# Patient Record
Sex: Female | Born: 1995 | Race: White | Hispanic: No | Marital: Single | Smoking: Former smoker
Health system: Southern US, Community
[De-identification: ages and names within clinical notes are randomized; demographics above are authoritative.]

## PROBLEM LIST (undated history)

## (undated) ENCOUNTER — Inpatient Hospital Stay (HOSPITAL_COMMUNITY): Payer: Self-pay

## (undated) DIAGNOSIS — Z789 Other specified health status: Secondary | ICD-10-CM

## (undated) HISTORY — PX: NO PAST SURGERIES: SHX2092

---
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
## 2015-01-30 ENCOUNTER — Emergency Department (HOSPITAL_COMMUNITY)
Admission: EM | Admit: 2015-01-30 | Discharge: 2015-01-30 | Disposition: A | Discharge status: Home / Self Care | Payer: Self-pay | Attending: Emergency Medicine | Admitting: Emergency Medicine

## 2015-01-30 ENCOUNTER — Encounter (HOSPITAL_COMMUNITY): Payer: Self-pay | Admitting: Emergency Medicine

## 2015-01-30 DIAGNOSIS — H109 Unspecified conjunctivitis: Secondary | ICD-10-CM | POA: Insufficient documentation

## 2015-01-30 DIAGNOSIS — L03011 Cellulitis of right finger: Secondary | ICD-10-CM | POA: Insufficient documentation

## 2015-01-30 MED ORDER — LIDOCAINE HCL (PF) 1 % IJ SOLN
5.0000 mL | Freq: Once | INTRAMUSCULAR | Status: AC
  Administered 2015-01-30: 5 mL
  Filled 2015-01-30: qty 5

## 2015-01-30 MED ORDER — POLYMYXIN B-TRIMETHOPRIM 10000-0.1 UNIT/ML-% OP SOLN
1.0000 [drp] | OPHTHALMIC | Status: DC
  Administered 2015-01-30: 1 [drp] via OPHTHALMIC
  Filled 2015-01-30: qty 10

## 2015-01-30 MED ORDER — CEPHALEXIN 500 MG PO CAPS: 500.0000 mg | ORAL_CAPSULE | Freq: Four times a day (QID) | ORAL | Status: DC

## 2015-01-30 MED ORDER — CHLORHEXIDINE GLUCONATE 4 % EX LIQD: Freq: Every day | CUTANEOUS | Status: DC | PRN

## 2015-01-30 NOTE — Discharge Instructions (Signed)
Paronychia °Paronychia is an infection of the skin that surrounds a nail. It usually affects the skin around a fingernail, but it may also occur near a toenail. It often causes pain and swelling around the nail. This condition may come on suddenly or develop over a longer period. In some cases, a collection of pus (abscess) can form near or under the nail. Usually, paronychia is not serious and it clears up with treatment. °CAUSES °This condition may be caused by bacteria or fungi. It is commonly caused by either Streptococcus or Staphylococcus bacteria. The bacteria or fungi often cause the infection by getting into the affected area through an opening in the skin, such as a cut or a hangnail. °RISK FACTORS °This condition is more likely to develop in: °· People who get their hands wet often, such as those who work as dishwashers, bartenders, or nurses. °· People who bite their fingernails or suck their thumbs. °· People who trim their nails too short. °· People who have hangnails or injured fingertips. °· People who get manicures. °· People who have diabetes. °SYMPTOMS °Symptoms of this condition include: °· Redness and swelling of the skin near the nail. °· Tenderness around the nail when you touch the area. °· Pus-filled bumps under the cuticle. The cuticle is the skin at the base or sides of the nail. °· Fluid or pus under the nail. °· Throbbing pain in the area. °DIAGNOSIS °This condition is usually diagnosed with a physical exam. In some cases, a sample of pus may be taken from an abscess to be tested in a lab. This can help to determine what type of bacteria or fungi is causing the condition. °TREATMENT °Treatment for this condition depends on the cause and severity of the condition. If the condition is mild, it may clear up on its own in a few days. Your health care provider may recommend soaking the affected area in warm water a few times a day. When treatment is needed, the options may  include: °· Antibiotic medicine, if the condition is caused by a bacterial infection. °· Antifungal medicine, if the condition is caused by a fungal infection. °· Incision and drainage, if an abscess is present. In this procedure, the health care provider will cut open the abscess so the pus can drain out. °HOME CARE INSTRUCTIONS °· Soak the affected area in warm water if directed to do so by your health care provider. You may be told to do this for 20 minutes, 2-3 times a day. Keep the area dry in between soakings. °· Take medicines only as directed by your health care provider. °· If you were prescribed an antibiotic medicine, finish all of it even if you start to feel better. °· Keep the affected area clean. °· Do not try to drain a fluid-filled bump yourself. °· If you will be washing dishes or performing other tasks that require your hands to get wet, wear rubber gloves. You should also wear gloves if your hands might come in contact with irritating substances, such as cleaners or chemicals. °· Follow your health care provider's instructions about: °¨ Wound care. °¨ Bandage (dressing) changes and removal. °SEEK MEDICAL CARE IF: °· Your symptoms get worse or do not improve with treatment. °· You have a fever or chills. °· You have redness spreading from the affected area. °· You have continued or increased fluid, blood, or pus coming from the affected area. °· Your finger or knuckle becomes swollen or is difficult to move. °  °  This information is not intended to replace advice given to you by your health care provider. Make sure you discuss any questions you have with your health care provider.   Document Released: 07/17/2000 Document Revised: 06/07/2014 Document Reviewed: 12/29/2013 Elsevier Interactive Patient Education 2016 Elsevier Inc. Viral Conjunctivitis Viral conjunctivitis is an inflammation of the clear membrane that covers the white part of your eye and the inner surface of your eyelid  (conjunctiva). The inflammation is caused by a viral infection. The blood vessels in the conjunctiva become inflamed, causing the eye to become red or pink, and often itchy. Viral conjunctivitis can easily be passed from one person to another (contagious). CAUSES  Viral conjunctivitis is caused by a virus. A virus is a type of contagious germ. It can be spread by touching objects that have been contaminated with the virus, such as doorknobs or towels.  SYMPTOMS  Symptoms of viral conjunctivitis may include:   Eye redness.  Tearing or watery eyes.  Itchy eyes.  Burning feeling in the eyes.  Clear drainage from the eye.  Swollen eyelids.  A gritty feeling in the eye.  Light sensitivity. DIAGNOSIS  Viral conjunctivitis may be diagnosed with a medical history and physical exam. If you have discharge from your eye, the discharge may be tested to rule out other causes of conjunctivitis.  TREATMENT  Viral conjunctivitis does not respond to medicines that kill bacteria (antibiotics). Treatment for viral conjunctivitis is directed at stopping a bacterial infection from developing in addition to the viral infection. Treatment also aims to relieve your symptoms, such as itching. This may be done with antihistamine drops or other eye medicines. HOME CARE INSTRUCTIONS  Take medicines only as directed by your health care provider.  Avoid touching or rubbing your eyes.  Apply a warm, clean washcloth to your eye for 10-20 minutes, 3-4 times per day.  If you wear contact lenses, do not wear them until the inflammation is gone and your health care provider says it is safe to wear them again. Ask your health care provider how to sterilize or replace your contact lenses before using them again. Wear glasses until you can resume wearing contacts.  Avoid wearing eye makeup until the inflammation is gone. Throw away any old eye cosmetics that may be contaminated.  Change or wash your pillowcase every  day.  Do not share towels or washcloths. This may spread the infection.  Wash your hands often with soap and water. Use paper towels to dry your hands.  Gently wipe away any drainage from your eye with a warm, wet washcloth or a cotton ball.  Be very careful to avoid touching the edge of the eyelid with the eye drop bottle or ointment tube when applying medicines to the affected eye. This will stop you from spreading the infection to the other eye or to other people. SEEK MEDICAL CARE IF:   Your symptoms do not improve with treatment.  You have increased pain.  Your vision becomes blurry.  You have a fever.  You have facial pain, redness, or swelling.  You have new symptoms.  Your symptoms get worse.   This information is not intended to replace advice given to you by your health care provider. Make sure you discuss any questions you have with your health care provider.   Document Released: 04/13/2002 Document Revised: 07/15/2005 Document Reviewed: 11/02/2013 Elsevier Interactive Patient Education [employer] Elsevier Inc.

## 2015-01-30 NOTE — ED Notes (Signed)
Pt departed in NAD.  

## 2015-01-30 NOTE — ED Notes (Signed)
Vision 20/15 out of infected L eye.Dr. Truitt MerleNotified

## 2015-01-30 NOTE — ED Provider Notes (Signed)
CSN: [id number]     Arrival date & time 01/30/15  2002 History  By signing my name below, I, Pamela Mitchell, attest that this documentation has been prepared under the direction and in the presence of Roxy Horsemanobert Stephanie Mcglone, PA-C Electronically Signed: Jarvis Morganaylor Mitchell, ED Scribe. 01/30/2015. 8:38 PM.    Chief Complaint  Patient presents with  . Eye Problem   The history is provided by the patient. No language interpreter was used.    HPI Comments: Pamela Mitchell is a 19 y.o. female with no PMHx who presents to the Emergency Department with a chief complaint of constant, moderate, left eye redness onset today. She reports associated green discharge and pain to her left eye. Pt denies any aggravating/alleviating factors. She has not taken any medications or tried any treatments prior to arrival. Pt denies any known foreign objects in her eye. She denies any known sick contacts. Pt states she has not used any new makeup products on her eyes. She denies any visual changes, eye itching, or other associated symptoms at this time.  Pt has a second complaint of constant, moderate, distal right middle finger pain onset 3 days. She reports associated swelling and redness to the tip of the finger and along the nailbed. Pt notes she had a fake nail to the finger that broke and she had to remove it. She states the pain is exacerbated with applied pressure to the nail. She denies any alleviating factors. Pt has not had any medications prior to arrival or other treatments tried. She denies any fevers, chills, or other associated symptoms.   History reviewed. No pertinent past medical history. History reviewed. No pertinent past surgical history. No family history on file. Social History  Substance Use Topics  . Smoking status: None  . Smokeless tobacco: None  . Alcohol Use: None   OB History    No data available     Review of Systems  Eyes: Positive for pain, discharge and redness. Negative for itching and  visual disturbance.  Musculoskeletal: Positive for arthralgias.  Skin: Positive for color change.      Allergies  Review of patient's allergies indicates no known allergies.  Home Medications   Prior to Admission medications   Not on File   Triage Vitals: BP 134/76 mmHg  Pulse 97  Temp(Src) 98 F (36.7 C) (Oral)  Resp 16  Ht 5\' 1"  (1.549 m)  Wt 122 lb (55.339 kg)  BMI 23.06 kg/m2  SpO2 98%  LMP 01/23/2015 (Approximate)  Physical Exam Physical Exam  Constitutional: Pt is oriented to person, place, and time. Pt appears well-developed and well-nourished. No distress.  HENT:  Head: Normocephalic and atraumatic.  Nose: Nose normal. No mucosal edema or rhinorrhea.  Mouth/Throat: Uvula is midline, oropharynx is clear and moist and mucous membranes are normal. No uvula swelling. No oropharyngeal exudate, posterior oropharyngeal edema, posterior oropharyngeal erythema or tonsillar abscesses.  Eyes: left conjunctivitis. Pupils are equal, round, and reactive to light. Lids are everted and swept, no foreign bodies found. Pupils equal round and reactive to light No vertical, horizontal or rotational nystagmus No visible foreign body    Visual Acuity  Right Eye Distance:   Left Eye Distance: 20/15 Bilateral Distance:    Right Eye Near:   Left Eye Near:    Bilateral Near:      Neck: Normal range of motion.   Cardiovascular: Normal rate, regular rhythm and intact distal pulses.   Pulmonary/Chest: Effort normal. No respiratory distress.  Musculoskeletal: Normal range  of motion.  Neurological: Pt is alert and oriented to person, place, and time.  Cranial Nerves:  II:  Peripheral visual fields grossly normal, pupils equal, round, reactive to light III,IV, VI: ptosis not present, extra-ocular motions intact bilaterally  Skin: Skin is warm and dry. Pt is not diaphoretic.  Paranychia noted to distal left middle finger Psychiatric: Pt has a normal mood and affect.  Nursing note  and vitals reviewed.   ED Course  Procedures (including critical care time)  DIAGNOSTIC STUDIES: Oxygen Saturation is 98% on RA, normal by my interpretation.    COORDINATION OF CARE:  INCISION AND DRAINAGE Performed by: Roxy Horseman Consent: Verbal consent obtained. Risks and benefits: risks, benefits and alternatives were discussed Type: abscess  Body area: Right middle finger  Anesthesia: Digital block Incision was made with a scalpel.  Local anesthetic: lidocaine 2% without epinephrine  Anesthetic total: 3 ml  Complexity: complex Blunt dissection to break up loculations  Drainage: purulent  Drainage amount: moderate  Packing material: none  Patient tolerance: Patient tolerated the procedure well with no immediate complications.       MDM   Final diagnoses:  Conjunctivitis of left eye  Paronychia, right   Patient with conjunctivitis, will treat with polytrim and recommend ophthalmology follow-up in 2 days.  Also has paronychia. This was drained in the emergency department with good success. Recommend hand follow-up.  I personally performed the services described in this documentation, which was scribed in my presence. The recorded information has been reviewed and is accurate.        Roxy Horseman, PA-C 01/30/15 2352  Derwood Kaplan, MD 01/31/15 [phone]

## 2015-01-30 NOTE — ED Notes (Signed)
Pt noticed "green stuff" coming out of her eye today. Also notes swelling, discomfort to finger on L hand.

## 2016-02-05 NOTE — L&D Delivery Note (Signed)
Patient is a 21 y.o. now G1P1001 s/p NSVD at 2222w1d, who was admitted for SOL, SROM at 0323 am, and had uncomplicated labor course. GBS neg  Delivery Note At 4:54 AM a viable female was delivered via Vaginal, Spontaneous Delivery  Presentation: LOA.  APGAR: 9, 9 weight  .   Placenta status: intact; to L&D. 3-v cord  Anesthesia:  IV pain meds Episiotomy: None Lacerations: 2nd degree;Perineal Suture Repair: 3.0 vicryl Est. Blood Loss (mL): 100  Head delivered LOA. No nuchal cord present. Shoulder and body delivered in usual fashion. Infant with spontaneous cry, placed on mother's abdomen, dried and bulb suctioned. Cord clamped x 2 after 1-minute delay, and cut by family member. Cord blood drawn. Placenta delivered spontaneously with gentle cord traction. Fundus firm with massage and Pitocin. Perineum inspected and found to have 2nd degree laceration, which was repaired with 3.0 Vycril with good hemostasis achieved.  Mom to postpartum.  Baby to Couplet care / Skin to Skin.   Raynelle FanningJulie P. Valen Mascaro, MD OB Fellow 10/14/16, 5:35 AM

## 2016-03-13 ENCOUNTER — Encounter: Payer: Self-pay | Admitting: Obstetrics & Gynecology

## 2016-03-13 ENCOUNTER — Ambulatory Visit (INDEPENDENT_AMBULATORY_CARE_PROVIDER_SITE_OTHER): Payer: Medicaid Other | Admitting: *Deleted

## 2016-03-13 DIAGNOSIS — Z3201 Encounter for pregnancy test, result positive: Secondary | ICD-10-CM | POA: Diagnosis not present

## 2016-03-13 DIAGNOSIS — Z3200 Encounter for pregnancy test, result unknown: Secondary | ICD-10-CM

## 2016-03-13 LAB — POCT PREGNANCY, URINE: PREG TEST UR: POSITIVE — AB

## 2016-03-13 NOTE — Progress Notes (Signed)
+  UPT today.  LMP - 01/07/16 - uncertain.  EDD - 10/13/16 Medication reconciliation completed.

## 2016-03-21 LAB — OB RESULTS CONSOLE RPR: RPR: NONREACTIVE

## 2016-03-21 LAB — OB RESULTS CONSOLE HIV ANTIBODY (ROUTINE TESTING): HIV: NONREACTIVE

## 2016-03-21 LAB — OB RESULTS CONSOLE ANTIBODY SCREEN: ANTIBODY SCREEN: NEGATIVE

## 2016-03-21 LAB — OB RESULTS CONSOLE GC/CHLAMYDIA
Chlamydia: NEGATIVE
Gonorrhea: NEGATIVE

## 2016-03-21 LAB — OB RESULTS CONSOLE ABO/RH: RH Type: NEGATIVE

## 2016-03-21 LAB — OB RESULTS CONSOLE HEPATITIS B SURFACE ANTIGEN: Hepatitis B Surface Ag: NEGATIVE

## 2016-03-21 LAB — OB RESULTS CONSOLE RUBELLA ANTIBODY, IGM: RUBELLA: IMMUNE

## 2016-03-22 ENCOUNTER — Other Ambulatory Visit (HOSPITAL_COMMUNITY): Payer: Self-pay | Admitting: Nurse Practitioner

## 2016-03-22 DIAGNOSIS — Z3682 Encounter for antenatal screening for nuchal translucency: Secondary | ICD-10-CM

## 2016-03-22 DIAGNOSIS — Z3A13 13 weeks gestation of pregnancy: Secondary | ICD-10-CM

## 2016-03-26 ENCOUNTER — Encounter (HOSPITAL_COMMUNITY): Payer: Self-pay | Admitting: Nurse Practitioner

## 2016-04-02 ENCOUNTER — Inpatient Hospital Stay (HOSPITAL_COMMUNITY)
Admission: AD | Admit: 2016-04-02 | Discharge: 2016-04-02 | Disposition: A | Discharge status: Home / Self Care | Payer: Medicaid Other | Source: Ambulatory Visit | Attending: Family Medicine | Admitting: Family Medicine

## 2016-04-02 ENCOUNTER — Encounter (HOSPITAL_COMMUNITY): Payer: Self-pay | Admitting: *Deleted

## 2016-04-02 DIAGNOSIS — M5432 Sciatica, left side: Secondary | ICD-10-CM | POA: Insufficient documentation

## 2016-04-02 DIAGNOSIS — M545 Low back pain: Secondary | ICD-10-CM | POA: Insufficient documentation

## 2016-04-02 DIAGNOSIS — M549 Dorsalgia, unspecified: Secondary | ICD-10-CM

## 2016-04-02 DIAGNOSIS — O26891 Other specified pregnancy related conditions, first trimester: Secondary | ICD-10-CM | POA: Diagnosis not present

## 2016-04-02 DIAGNOSIS — O9989 Other specified diseases and conditions complicating pregnancy, childbirth and the puerperium: Secondary | ICD-10-CM

## 2016-04-02 DIAGNOSIS — O99891 Other specified diseases and conditions complicating pregnancy: Secondary | ICD-10-CM

## 2016-04-02 LAB — URINALYSIS, ROUTINE W REFLEX MICROSCOPIC
Bilirubin Urine: NEGATIVE
GLUCOSE, UA: NEGATIVE mg/dL
HGB URINE DIPSTICK: NEGATIVE
KETONES UR: NEGATIVE mg/dL
Leukocytes, UA: NEGATIVE
Nitrite: NEGATIVE
PH: 7 (ref 5.0–8.0)
PROTEIN: NEGATIVE mg/dL
Specific Gravity, Urine: 1.015 (ref 1.005–1.030)

## 2016-04-02 MED ORDER — CYCLOBENZAPRINE HCL 10 MG PO TABS: 5.0000 mg | ORAL_TABLET | Freq: Three times a day (TID) | ORAL | 0 refills | Status: DC | PRN

## 2016-04-02 MED ORDER — IBUPROFEN 600 MG PO TABS: 600.0000 mg | ORAL_TABLET | Freq: Four times a day (QID) | ORAL | 0 refills | Status: DC | PRN

## 2016-04-02 MED ORDER — IBUPROFEN 600 MG PO TABS
600.0000 mg | ORAL_TABLET | Freq: Once | ORAL | Status: AC
  Administered 2016-04-02: 600 mg via ORAL
  Filled 2016-04-02: qty 1

## 2016-04-02 NOTE — Discharge Instructions (Signed)
Sciatica Sciatica is pain, numbness, weakness, or tingling along the path of the sciatic nerve. The sciatic nerve starts in the lower back and runs down the back of each leg. The nerve controls the muscles in the lower leg and in the back of the knee. It also provides feeling (sensation) to the back of the thigh, the lower leg, and the sole of the foot. Sciatica is a symptom of another medical condition that pinches or puts pressure on the sciatic nerve. Generally, sciatica only affects one side of the body. Sciatica usually goes away on its own or with treatment. In some cases, sciatica may keep coming back (recur). What are the causes? This condition is caused by pressure on the sciatic nerve, or pinching of the sciatic nerve. This may be the result of:  A disk in between the bones of the spine (vertebrae) bulging out too far (herniated disk).  Age-related changes in the spinal disks (degenerative disk disease).  A pain disorder that affects a muscle in the buttock (piriformis syndrome).  Extra bone growth (bone spur) near the sciatic nerve.  An injury or break (fracture) of the pelvis.  Pregnancy.  Tumor (rare). What increases the risk? The following factors may make you more likely to develop this condition:  Playing sports that place pressure or stress on the spine, such as football or weight lifting.  Having poor strength and flexibility.  A history of back injury.  A history of back surgery.  Sitting for long periods of time.  Doing activities that involve repetitive bending or lifting.  Obesity. What are the signs or symptoms? Symptoms can vary from mild to very severe, and they may include:  Any of these problems in the lower back, leg, hip, or buttock:  Mild tingling or dull aches.  Burning sensations.  Sharp pains.  Numbness in the back of the calf or the sole of the foot.  Leg weakness.  Severe back pain that makes movement difficult. These symptoms may  get worse when you cough, sneeze, or laugh, or when you sit or stand for long periods of time. Being overweight may also make symptoms worse. In some cases, symptoms may recur over time. How is this diagnosed? This condition may be diagnosed based on:  Your symptoms.  A physical exam. Your health care provider may ask you to do certain movements to check whether those movements trigger your symptoms.  You may have tests, including:  Blood tests.  X-rays.  MRI.  CT scan. How is this treated? In many cases, this condition improves on its own, without any treatment. However, treatment may include:  Reducing or modifying physical activity during periods of pain.  Exercising and stretching to strengthen your abdomen and improve the flexibility of your spine.  Icing and applying heat to the affected area.  Medicines that help:  To relieve pain and swelling.  To relax your muscles.  Injections of medicines that help to relieve pain, irritation, and inflammation around the sciatic nerve (steroids).  Surgery. Follow these instructions at home: Medicines   Take over-the-counter and prescription medicines only as told by your health care provider.  Do not drive or operate heavy machinery while taking prescription pain medicine. Managing pain   If directed, apply ice to the affected area.  Put ice in a plastic bag.  Place a towel between your skin and the bag.  Leave the ice on for 20 minutes, 2-3 times a day.  After icing, apply heat to the  affected area before you exercise or as often as told by your health care provider. Use the heat source that your health care provider recommends, such as a moist heat pack or a heating pad.  Place a towel between your skin and the heat source.  Leave the heat on for 20-30 minutes.  Remove the heat if your skin turns bright red. This is especially important if you are unable to feel pain, heat, or cold. You may have a greater risk of  getting burned. Activity   Return to your normal activities as told by your health care provider. Ask your health care provider what activities are safe for you.  Avoid activities that make your symptoms worse.  Take brief periods of rest throughout the day. Resting in a lying or standing position is usually better than sitting to rest.  When you rest for longer periods, mix in some mild activity or stretching between periods of rest. This will help to prevent stiffness and pain.  Avoid sitting for long periods of time without moving. Get up and move around at least one time each hour.  Exercise and stretch regularly, as told by your health care provider.  Do not lift anything that is heavier than 10 lb (4.5 kg) while you have symptoms of sciatica. When you do not have symptoms, you should still avoid heavy lifting, especially repetitive heavy lifting.  When you lift objects, always use proper lifting technique, which includes:  Bending your knees.  Keeping the load close to your body.  Avoiding twisting. General instructions   Use good posture.  Avoid leaning forward while sitting.  Avoid hunching over while standing.  Maintain a healthy weight. Excess weight puts extra stress on your back and makes it difficult to maintain good posture.  Wear supportive, comfortable shoes. Avoid wearing high heels.  Avoid sleeping on a mattress that is too soft or too hard. A mattress that is firm enough to support your back when you sleep may help to reduce your pain.  Keep all follow-up visits as told by your health care provider. This is important. Contact a health care provider if:  You have pain that wakes you up when you are sleeping.  You have pain that gets worse when you lie down.  Your pain is worse than you have experienced in the past.  Your pain lasts longer than 4 weeks.  You experience unexplained weight loss. Get help right away if:  You lose control of your bowel  or bladder (incontinence).  You have:  Weakness in your lower back, pelvis, buttocks, or legs that gets worse.  Redness or swelling of your back.  A burning sensation when you urinate. This information is not intended to replace advice given to you by your health care provider. Make sure you discuss any questions you have with your health care provider. Document Released: 01/15/2001 Document Revised: 06/27/2015 Document Reviewed: 09/30/2014 Elsevier Interactive Patient Education  2017 Else[employer] Inc.   Back Pain in Pregnancy Back pain during pregnancy is common. Back pain may be caused by several factors that are related to changes during your pregnancy. Follow these instructions at home: Managing pain, stiffness, and swelling   If directed, apply ice for sudden (acute) back pain.  Put ice in a plastic bag.  Place a towel between your skin and the bag.  Leave the ice on for 20 minutes, 2-3 times per day.  If directed, apply heat to the affected area before you exercise:  Place a towel between your skin and the heat pack or heating pad.  Leave the heat on for 20-30 minutes.  Remove the heat if your skin turns bright red. This is especially important if you are unable to feel pain, heat, or cold. You may have a greater risk of getting burned. Activity   Exercise as told by your health care provider. Exercising is the best way to prevent or manage back pain.  Listen to your body when lifting. If lifting hurts, ask for help or bend your knees. This uses your leg muscles instead of your back muscles.  Squat down when picking up something from the floor. Do not bend over.  Only use bed rest as told by your health care provider. Bed rest should only be used for the most severe episodes of back pain. Standing, Sitting, and Lying Down   Do not stand in one place for long periods of time.  Use good posture when sitting. Make sure your head rests over your shoulders and is not  hanging forward. Use a pillow on your lower back if necessary.  Try sleeping on your side, preferably the left side, with a pillow or two between your legs. If you are sore after a night's rest, your bed may be too soft. A firm mattress may provide more support for your back during pregnancy. General instructions   Do not wear high heels.  Eat a healthy diet. Try to gain weight within your health care provider's recommendations.  Use a maternity girdle, elastic sling, or back brace as told by your health care provider.  Take over-the-counter and prescription medicines only as told by your health care provider.  Keep all follow-up visits as told by your health care provider. This is important. This includes any visits with any specialists, such as a physical therapist. Contact a health care provider if:  Your back pain interferes with your daily activities.  You have increasing pain in other parts of your body. Get help right away if:  You develop numbness, tingling, weakness, or problems with the use of your arms or legs.  You develop severe back pain that is not controlled with medicine.  You have a sudden change in bowel or bladder control.  You develop shortness of breath, dizziness, or you faint.  You develop nausea, vomiting, or sweating.  You have back pain that is a rhythmic, cramping pain similar to labor pains. Labor pain is usually 1-2 minutes apart, lasts for about 1 minute, and involves a bearing down feeling or pressure in your pelvis.  You have back pain and your water breaks or you have vaginal bleeding.  You have back pain or numbness that travels down your leg.  Your back pain developed after you fell.  You develop pain on one side of your back.  You see blood in your urine.  You develop skin blisters in the area of your back pain. This information is not intended to replace advice given to you by your health care provider. Make sure you discuss any  questions you have with your health care provider. Document Released: 05/01/2005 Document Revised: 06/29/2015 Document Reviewed: 10/05/2014 Elsevier Interactive Patient Education  2017 [employer].

## 2016-04-02 NOTE — MAU Note (Signed)
So, far the past wk, she has been having excruciating  Pain in her low back, in the bone.  Never had anything like this before

## 2016-04-02 NOTE — MAU Note (Signed)
Pt reports pain when she lies down or stands up. Even bending over hurts. Her back feels really tight. Pain is worse on her left side. Pt denies any strenuous activity.while lying on her side it feels slightly better. Pt took tylenol for a headache a few days ago and can not recall if it helped her back pain or not. Pt is unsure if she wants to take any pain meds for this pain today.

## 2016-04-03 LAB — HIV ANTIBODY (ROUTINE TESTING W REFLEX): HIV Screen 4th Generation wRfx: NONREACTIVE

## 2016-04-08 ENCOUNTER — Ambulatory Visit (HOSPITAL_COMMUNITY)
Admission: RE | Admit: 2016-04-08 | Discharge: 2016-04-08 | Disposition: A | Discharge status: Home / Self Care | Payer: Medicaid Other | Source: Ambulatory Visit | Attending: Nurse Practitioner | Admitting: Nurse Practitioner

## 2016-04-08 ENCOUNTER — Encounter (HOSPITAL_COMMUNITY): Payer: Self-pay

## 2016-04-08 ENCOUNTER — Other Ambulatory Visit: Payer: Self-pay

## 2016-04-08 DIAGNOSIS — Z3682 Encounter for antenatal screening for nuchal translucency: Secondary | ICD-10-CM | POA: Diagnosis not present

## 2016-04-08 DIAGNOSIS — Z3A13 13 weeks gestation of pregnancy: Secondary | ICD-10-CM | POA: Diagnosis not present

## 2016-04-08 HISTORY — DX: Other specified health status: Z78.9

## 2016-06-07 ENCOUNTER — Inpatient Hospital Stay (HOSPITAL_COMMUNITY)
Admission: AD | Admit: 2016-06-07 | Discharge: 2016-06-07 | Disposition: A | Discharge status: Home / Self Care | Payer: Medicaid Other | Source: Ambulatory Visit | Attending: Obstetrics and Gynecology | Admitting: Obstetrics and Gynecology

## 2016-06-07 ENCOUNTER — Encounter (HOSPITAL_COMMUNITY): Payer: Self-pay | Admitting: *Deleted

## 2016-06-07 DIAGNOSIS — Z3A21 21 weeks gestation of pregnancy: Secondary | ICD-10-CM | POA: Diagnosis not present

## 2016-06-07 DIAGNOSIS — R110 Nausea: Secondary | ICD-10-CM | POA: Insufficient documentation

## 2016-06-07 DIAGNOSIS — O9989 Other specified diseases and conditions complicating pregnancy, childbirth and the puerperium: Secondary | ICD-10-CM

## 2016-06-07 DIAGNOSIS — O26892 Other specified pregnancy related conditions, second trimester: Secondary | ICD-10-CM | POA: Diagnosis not present

## 2016-06-07 DIAGNOSIS — R1032 Left lower quadrant pain: Secondary | ICD-10-CM

## 2016-06-07 DIAGNOSIS — N949 Unspecified condition associated with female genital organs and menstrual cycle: Secondary | ICD-10-CM

## 2016-06-07 DIAGNOSIS — R102 Pelvic and perineal pain: Secondary | ICD-10-CM | POA: Insufficient documentation

## 2016-06-07 DIAGNOSIS — Z87891 Personal history of nicotine dependence: Secondary | ICD-10-CM | POA: Diagnosis not present

## 2016-06-07 DIAGNOSIS — R109 Unspecified abdominal pain: Secondary | ICD-10-CM | POA: Diagnosis present

## 2016-06-07 LAB — URINALYSIS, ROUTINE W REFLEX MICROSCOPIC
Bilirubin Urine: NEGATIVE
Glucose, UA: NEGATIVE mg/dL
Hgb urine dipstick: NEGATIVE
Ketones, ur: NEGATIVE mg/dL
Leukocytes, UA: NEGATIVE
Nitrite: NEGATIVE
Protein, ur: NEGATIVE mg/dL
Specific Gravity, Urine: 1.013 (ref 1.005–1.030)
pH: 9 — ABNORMAL HIGH (ref 5.0–8.0)

## 2016-06-07 LAB — CBC WITH DIFFERENTIAL/PLATELET
Basophils Absolute: 0 K/uL (ref 0.0–0.1)
Basophils Relative: 0 %
Eosinophils Absolute: 0.1 K/uL (ref 0.0–0.7)
Eosinophils Relative: 1 %
HCT: 40 % (ref 36.0–46.0)
Hemoglobin: 14.1 g/dL (ref 12.0–15.0)
Lymphocytes Relative: 16 %
Lymphs Abs: 1.4 K/uL (ref 0.7–4.0)
MCH: 32.3 pg (ref 26.0–34.0)
MCHC: 35.3 g/dL (ref 30.0–36.0)
MCV: 91.7 fL (ref 78.0–100.0)
Monocytes Absolute: 0.5 K/uL (ref 0.1–1.0)
Monocytes Relative: 6 %
Neutro Abs: 6.7 K/uL (ref 1.7–7.7)
Neutrophils Relative %: 77 %
Platelets: 131 K/uL — ABNORMAL LOW (ref 150–400)
RBC: 4.36 MIL/uL (ref 3.87–5.11)
RDW: 13.5 % (ref 11.5–15.5)
WBC: 8.8 K/uL (ref 4.0–10.5)

## 2016-06-07 LAB — WET PREP, GENITAL
Clue Cells Wet Prep HPF POC: NONE SEEN
SPERM: NONE SEEN
Trich, Wet Prep: NONE SEEN
Yeast Wet Prep HPF POC: NONE SEEN

## 2016-06-07 MED ORDER — IBUPROFEN 600 MG PO TABS
600.0000 mg | ORAL_TABLET | Freq: Once | ORAL | Status: AC
  Administered 2016-06-07: 600 mg via ORAL
  Filled 2016-06-07: qty 1

## 2016-06-07 NOTE — MAU Note (Signed)
Pt presents to MAU with complaints of pain in her left lower abdomen since this morning. Denies any vaginal bleeding. Mild pain

## 2016-06-07 NOTE — Discharge Instructions (Signed)
Round Ligament Pain During Pregnancy °Round ligament pain is a sharp pain or jabbing feeling often felt in the lower belly or groin area on one or both sides. It is one of the most common complaints during pregnancy and is considered a normal part of pregnancy. It is most often felt during the second trimester. ° °Here is what you need to know about round ligament pain, including some tips to help you feel better. ° °Causes of Round Ligament Pain ° °Several thick ligaments surround and support your womb (uterus) as it grows during pregnancy. One of them is called the round ligament. ° °The round ligament connects the front part of the womb to your groin, the area where your legs attach to your pelvis. The round ligament normally tightens and relaxes slowly. ° °As your baby and womb grow, the round ligament stretches. That makes it more likely to become strained. ° °Sudden movements can cause the ligament to tighten quickly, like a rubber band snapping. This causes a sudden and quick jabbing feeling. ° °Symptoms of Round Ligament Pain ° °Round ligament pain can be concerning and uncomfortable. But it is considered normal as your body changes during pregnancy. ° °The symptoms of round ligament pain include a sharp, sudden spasm in the belly. It usually affects the right side, but it may happen on both sides. The pain only lasts a few seconds. ° °Exercise may cause the pain, as will rapid movements such as: ° °sneezing °coughing °laughing °rolling over in bed °standing up too quickly ° °Comfort Measures: ° °Soak in a warm tub °Tylenol 1000 mg by mouth every 6-8 hrs as needed for pain °Slow position changes °Laying on the affected side ° ° °

## 2016-06-07 NOTE — MAU Provider Note (Signed)
History     CSN: [id number]  Arrival date and time: 06/07/16 1516   First Provider Initiated Contact with Patient 06/07/16 1756      Chief Complaint  Patient presents with  . Abdominal Pain    [name] is a 21 yo G1P0 at 21.[redacted] wks gestation by LMP presenting with complaints of LLQ pain that started suddenly in the middle of the night intermittently waking her up all night.  The pain has been on-going since she got up at 10 am and hurts more with movement.  She reports some nausea associated with the pain, but no vomiting. She has not taken any medication for the pain. She denies any LOF. No recent SI.  [facility].  Abdominal Pain  This is a new problem. The current episode started today. The onset quality is sudden. The problem occurs constantly. The problem has been unchanged. The pain is located in the LLQ. The pain is at a severity of 7/10. The pain is moderate. The quality of the pain is aching. The abdominal pain does not radiate. Associated symptoms include nausea. Pertinent negatives include no vomiting. Nothing aggravates the pain. The pain is relieved by nothing. She has tried nothing for the symptoms.     Past Medical History:  Diagnosis Date  . Medical history non-contributory     Past Surgical History:  Procedure Laterality Date  . NO PAST SURGERIES      No family history on file.  Social History  Substance Use Topics  . Smoking status: Former Smoker    Quit date: 02/16/2016  . Smokeless tobacco: Never Used  . Alcohol use No    Allergies: No Known Allergies  Prescriptions Prior to Admission  Medication Sig Dispense Refill Last Dose  . acetaminophen (TYLENOL) 500 MG tablet Take 1,000 mg by mouth every 6 (six) hours as needed for mild pain or fever.   Past Month at Unknown time  . Prenatal Vit-Fe Fumarate-FA (PRENATAL MULTIVITAMIN) TABS tablet Take 1 tablet by mouth daily at 12 noon.   06/07/2016 at Unknown time  .  cyclobenzaprine (FLEXERIL) 10 MG tablet Take 0.5-1 tablets (5-10 mg total) by mouth 3 (three) times daily as needed for muscle spasms. (Patient not taking: Reported on 04/08/2016) 30 tablet 0 Not Taking  . ibuprofen (ADVIL,MOTRIN) 600 MG tablet Take 1 tablet (600 mg total) by mouth every 6 (six) hours as needed for moderate pain. Do not use after [redacted] weeks gestation. Use sparingly. (Patient not taking: Reported on 04/08/2016) 30 tablet 0 Not Taking    Review of Systems  Constitutional: Negative.   HENT: Negative.   Eyes: Negative.   Respiratory: Negative.   Cardiovascular: Negative.   Gastrointestinal: Positive for abdominal pain and nausea. Negative for vomiting.  Endocrine: Negative.   Genitourinary: Positive for pelvic pain (LLQ). Negative for vaginal bleeding and vaginal discharge.  Musculoskeletal: Negative.   Skin: Negative.   Allergic/Immunologic: Negative.   Neurological: Negative.   Hematological: Negative.   Psychiatric/Behavioral: Negative.    Results for orders placed or performed during the hospital encounter of 06/07/16 (from the past 24 hour(s))  Urinalysis, Routine w reflex microscopic     Status: Abnormal   Collection Time: 06/07/16  3:54 PM  Result Value Ref Range   Color, Urine YELLOW YELLOW   APPearance CLEAR CLEAR   Specific Gravity, Urine 1.013 1.005 - 1.030   pH 9.0 (H) 5.0 - 8.0   Glucose, UA NEGATIVE NEGATIVE mg/dL  Hgb urine dipstick NEGATIVE NEGATIVE   Bilirubin Urine NEGATIVE NEGATIVE   Ketones, ur NEGATIVE NEGATIVE mg/dL   Protein, ur NEGATIVE NEGATIVE mg/dL   Nitrite NEGATIVE NEGATIVE   Leukocytes, UA NEGATIVE NEGATIVE  Wet prep, genital     Status: Abnormal   Collection Time: 06/07/16  6:03 PM  Result Value Ref Range   Yeast Wet Prep HPF POC NONE SEEN NONE SEEN   Trich, Wet Prep NONE SEEN NONE SEEN   Clue Cells Wet Prep HPF POC NONE SEEN NONE SEEN   WBC, Wet Prep HPF POC MODERATE (A) NONE SEEN   Sperm NONE SEEN   CBC with Differential/Platelet      Status: Abnormal (Preliminary result)   Collection Time: 06/07/16  6:14 PM  Result Value Ref Range   WBC 8.8 4.0 - 10.5 K/uL   RBC 4.36 3.87 - 5.11 MIL/uL   Hemoglobin 14.1 12.0 - 15.0 g/dL   HCT 40.9 81.1 - 91.4 %   MCV 91.7 78.0 - 100.0 fL   MCH 32.3 26.0 - 34.0 pg   MCHC 35.3 30.0 - 36.0 g/dL   RDW 78.2 95.6 - 21.3 %   Platelets 131 (L) 150 - 400 K/uL   Neutrophils Relative % 77 %   Neutro Abs 6.7 1.7 - 7.7 K/uL   Lymphocytes Relative 16 %   Lymphs Abs 1.4 0.7 - 4.0 K/uL   Monocytes Relative 6 %   Monocytes Absolute 0.5 0.1 - 1.0 K/uL   Eosinophils Relative 1 %   Eosinophils Absolute 0.1 0.0 - 0.7 K/uL   Basophils Relative 0 %   Basophils Absolute 0.0 0.0 - 0.1 K/uL   Other PENDING %   FHTs by doppler: 154 bpm Physical Exam   Blood pressure 113/77, pulse 93, temperature 98.6 F (37 C), resp. rate 18, height 5\' 2"  (1.575 m), weight 59 kg (130 lb), last menstrual period 01/07/2016.  Physical Exam  Constitutional: She is oriented to person, place, and time. She appears well-developed and well-nourished.  HENT:  Head: Normocephalic.  Eyes: Pupils are equal, round, and reactive to light.  Neck: Normal range of motion.  Cardiovascular: Normal rate, regular rhythm, normal heart sounds and intact distal pulses.   Respiratory: Effort normal and breath sounds normal.  GI: Soft. Bowel sounds are normal.  Genitourinary:  Genitourinary Comments: Gravid, S=D, VE: copious amount of thick white d/c, cx: closed/long/firm/posterior, no CMT  Musculoskeletal: Normal range of motion.  Neurological: She is alert and oriented to person, place, and time. She has normal reflexes.  Skin: Skin is warm and dry.  Psychiatric: She has a normal mood and affect. Her behavior is normal. Judgment and thought content normal.    MAU Course  Procedures  MDM CCUA CBC with Diff Wet prep GC/CT Ibuprofen 60 mg po now  Assessment and Plan  21 yo G1P0 at 21.[redacted] wks gestation  Round Ligament  Pain  Discharge home Instructions on RLP given Advised to soak in a warm tub, take Tylenol 1000 mg po every 6-8 hrs, try maternity support belt Keep scheduled appointment on 06/21/2016  Patient verbalized an understanding of the plan of care and agrees.  Raelyn Mora MSN, CNM 06/07/2016, 6:15 PM

## 2016-06-10 LAB — GC/CHLAMYDIA PROBE AMP (~~LOC~~) NOT AT ARMC
Chlamydia: NEGATIVE
Neisseria Gonorrhea: NEGATIVE

## 2016-07-11 ENCOUNTER — Inpatient Hospital Stay (HOSPITAL_COMMUNITY)
Admission: AD | Admit: 2016-07-11 | Discharge: 2016-07-11 | Disposition: A | Discharge status: Home / Self Care | Payer: Medicaid Other | Source: Ambulatory Visit | Attending: Family Medicine | Admitting: Family Medicine

## 2016-07-11 ENCOUNTER — Encounter (HOSPITAL_COMMUNITY): Payer: Self-pay | Admitting: *Deleted

## 2016-07-11 DIAGNOSIS — Z3A26 26 weeks gestation of pregnancy: Secondary | ICD-10-CM | POA: Diagnosis not present

## 2016-07-11 DIAGNOSIS — R0981 Nasal congestion: Secondary | ICD-10-CM

## 2016-07-11 DIAGNOSIS — J0140 Acute pansinusitis, unspecified: Secondary | ICD-10-CM

## 2016-07-11 DIAGNOSIS — O99512 Diseases of the respiratory system complicating pregnancy, second trimester: Secondary | ICD-10-CM | POA: Insufficient documentation

## 2016-07-11 DIAGNOSIS — J069 Acute upper respiratory infection, unspecified: Secondary | ICD-10-CM | POA: Insufficient documentation

## 2016-07-11 DIAGNOSIS — O9989 Other specified diseases and conditions complicating pregnancy, childbirth and the puerperium: Secondary | ICD-10-CM

## 2016-07-11 DIAGNOSIS — Z87891 Personal history of nicotine dependence: Secondary | ICD-10-CM | POA: Diagnosis not present

## 2016-07-11 LAB — URINALYSIS, ROUTINE W REFLEX MICROSCOPIC
Bilirubin Urine: NEGATIVE
Glucose, UA: NEGATIVE mg/dL
Hgb urine dipstick: NEGATIVE
Ketones, ur: NEGATIVE mg/dL
LEUKOCYTES UA: NEGATIVE
NITRITE: NEGATIVE
PH: 7 (ref 5.0–8.0)
Protein, ur: NEGATIVE mg/dL
SPECIFIC GRAVITY, URINE: 1.014 (ref 1.005–1.030)

## 2016-07-11 MED ORDER — GUAIFENESIN ER 600 MG PO TB12: 600.0000 mg | ORAL_TABLET | Freq: Two times a day (BID) | ORAL | 1 refills | Status: DC

## 2016-07-11 MED ORDER — AMOXICILLIN 500 MG PO CAPS: 500.0000 mg | ORAL_CAPSULE | Freq: Three times a day (TID) | ORAL | 0 refills | Status: DC

## 2016-07-11 MED ORDER — CETIRIZINE HCL 10 MG PO TABS: 10.0000 mg | ORAL_TABLET | Freq: Every day | ORAL | 1 refills | Status: DC

## 2016-07-11 NOTE — MAU Note (Signed)
Pt has had nasal congestion,cough , Runny nose for the past 2 weeks. Thinks she might have a sinus infection. Denies any fever symptoms.

## 2016-07-11 NOTE — Discharge Instructions (Signed)

## 2016-07-11 NOTE — MAU Provider Note (Signed)
Chief Complaint:  URI   First Provider Initiated Contact with Patient 07/11/16 1313     HPI: Pamela Mitchell is a 21 y.o. G1P0 at 63w4dwho presents to maternity admissions reporting 2 week history of nasal congestion and sinus pain.  States discharge has changed colors.  No fever. She reports good fetal movement, denies LOF, vaginal bleeding, vaginal itching/burning, urinary symptoms, h/a, dizziness, n/v, diarrhea, constipation or fever/chills.    [facility]   This is a new problem. The current episode started 1 to 4 weeks ago. The problem has been gradually worsening. There has been no fever. Associated symptoms include congestion, rhinorrhea and sinus pain. Pertinent negatives include no abdominal pain, chest pain, coughing, diarrhea, dysuria, ear pain, headaches, nausea or wheezing. She has tried decongestant for the symptoms. The treatment provided mild relief.    RN Note: Pt has had nasal congestion,cough , Runny nose for the past 2 weeks. Thinks she might have a sinus infection. Denies any fever symptoms.    Electronically signed by Madaline Savage, RN at 6/     Past Medical History: Past Medical History:  Diagnosis Date  . Medical history non-contributory     Past obstetric history: OB History  Gravida Para Term Preterm AB Living  1         0  SAB TAB Ectopic Multiple Live Births               # Outcome Date GA Lbr Len/2nd Weight Sex Delivery Anes PTL Lv  1 Current               Past Surgical History: Past Surgical History:  Procedure Laterality Date  . NO PAST SURGERIES      Family History: No family history on file.  Social History: Social History  Substance Use Topics  . Smoking status: Former Smoker    Quit date: 02/16/2016  . Smokeless tobacco: Never Used  . Alcohol use No    Allergies: No Known Allergies  Meds:  Prescriptions Prior to Admission  Medication Sig Dispense Refill Last Dose  . acetaminophen  (TYLENOL) 500 MG tablet Take 1,000 mg by mouth every 6 (six) hours as needed for mild pain or fever.   Past Month at Unknown time  . Prenatal Vit-Fe Fumarate-FA (PRENATAL MULTIVITAMIN) TABS tablet Take 1 tablet by mouth daily at 12 noon.   06/07/2016 at Unknown time    I have reviewed patient's Past Medical Hx, Surgical Hx, Family Hx, Social Hx, medications and allergies.   ROS:  Review of Systems  HENT: Positive for congestion, rhinorrhea and sinus pain. Negative for ear pain.   Respiratory: Negative for cough and wheezing.   Cardiovascular: Negative for chest pain.  Gastrointestinal: Negative for abdominal pain, diarrhea and nausea.  Genitourinary: Negative for dysuria.  Neurological: Negative for headaches.   Other systems negative  Physical Exam  Patient Vitals for the past 24 hrs:  BP Temp Pulse Resp Height Weight  07/11/16 1230 108/74 98.3 F (36.8 C) 93 18 5\' 1"  (1.549 m) 135 lb 1.9 oz (61.3 kg)   Constitutional: Well-developed, well-nourished female in no acute distress.  EENT:  + sinus tenderness Cardiovascular: normal rate and rhythm Respiratory: normal effort, clear to auscultation bilaterally, no wheezing GI: Abd soft, non-tender, gravid appropriate for gestational age.   MS: Extremities nontender, no edema, normal ROM Neurologic: Alert and oriented x 4.  GU: Neg CVAT.  PELVIC EXAM:  deferred   Labs: Results  for orders placed or performed during the hospital encounter of 07/11/16 (from the past 24 hour(s))  Urinalysis, Routine w reflex microscopic     Status: Abnormal   Collection Time: 07/11/16 12:32 PM  Result Value Ref Range   Color, Urine YELLOW YELLOW   APPearance CLOUDY (A) CLEAR   Specific Gravity, Urine 1.014 1.005 - 1.030   pH 7.0 5.0 - 8.0   Glucose, UA NEGATIVE NEGATIVE mg/dL   Hgb urine dipstick NEGATIVE NEGATIVE   Bilirubin Urine NEGATIVE NEGATIVE   Ketones, ur NEGATIVE NEGATIVE mg/dL   Protein, ur NEGATIVE NEGATIVE mg/dL   Nitrite NEGATIVE  NEGATIVE   Leukocytes, UA NEGATIVE NEGATIVE      Imaging:  No results found.  MAU Course/MDM: I have ordered labs and reviewed results. UA neg Treatments in MAU included none Discussed nasal congestion may be related to allergies.  Recommend getting started on some Zyrtec. Will add Mucinex and amoxicillin.  Assessment: Single IUP at 8430w4d Sinusitis  Plan: Discharge home Rx Mucinex for congestion Rx Amoxicillin for sinusitis Rx Zyrtec for probable allergies Preterm Labor precautions and fetal kick counts Follow up in Office for prenatal visits and recheck of symptorms   Pt stable at time of discharge.  Wynelle BourgeoisMarie Maxie Slovacek CNM, MSN Certified Nurse-Midwife 07/11/2016 1:13 PM

## 2016-09-13 LAB — OB RESULTS CONSOLE GBS: GBS: NEGATIVE

## 2016-10-01 ENCOUNTER — Ambulatory Visit (INDEPENDENT_AMBULATORY_CARE_PROVIDER_SITE_OTHER): Payer: Self-pay | Admitting: Pediatrics

## 2016-10-01 DIAGNOSIS — Z7681 Expectant parent(s) prebirth pediatrician visit: Secondary | ICD-10-CM

## 2016-10-04 NOTE — Progress Notes (Signed)
Prenatal counseling for impending newborn done--1st child, 38wks, no complications, early prenatal 74Z76.81

## 2016-10-14 ENCOUNTER — Encounter (HOSPITAL_COMMUNITY): Payer: Self-pay | Admitting: *Deleted

## 2016-10-14 ENCOUNTER — Inpatient Hospital Stay (HOSPITAL_COMMUNITY)
Admission: AD | Admit: 2016-10-14 | Discharge: 2016-10-15 | DRG: 775 | Disposition: A | Discharge status: Home / Self Care | Payer: Medicaid Other | Source: Ambulatory Visit | Attending: Obstetrics and Gynecology | Admitting: Obstetrics and Gynecology

## 2016-10-14 DIAGNOSIS — Z87891 Personal history of nicotine dependence: Secondary | ICD-10-CM | POA: Diagnosis not present

## 2016-10-14 DIAGNOSIS — Z6791 Unspecified blood type, Rh negative: Secondary | ICD-10-CM

## 2016-10-14 DIAGNOSIS — O26893 Other specified pregnancy related conditions, third trimester: Principal | ICD-10-CM | POA: Diagnosis present

## 2016-10-14 DIAGNOSIS — Z3A40 40 weeks gestation of pregnancy: Secondary | ICD-10-CM

## 2016-10-14 DIAGNOSIS — F1291 Cannabis use, unspecified, in remission: Secondary | ICD-10-CM

## 2016-10-14 DIAGNOSIS — Z87898 Personal history of other specified conditions: Secondary | ICD-10-CM

## 2016-10-14 LAB — CBC
HCT: 37.9 % (ref 36.0–46.0)
Hemoglobin: 12.6 g/dL (ref 12.0–15.0)
MCH: 28.4 pg (ref 26.0–34.0)
MCHC: 33.2 g/dL (ref 30.0–36.0)
MCV: 85.4 fL (ref 78.0–100.0)
PLATELETS: 173 10*3/uL (ref 150–400)
RBC: 4.44 MIL/uL (ref 3.87–5.11)
RDW: 14.9 % (ref 11.5–15.5)
WBC: 15.9 10*3/uL — AB (ref 4.0–10.5)

## 2016-10-14 LAB — RAPID URINE DRUG SCREEN, HOSP PERFORMED
Amphetamines: NOT DETECTED
BARBITURATES: NOT DETECTED
BENZODIAZEPINES: NOT DETECTED
Cocaine: NOT DETECTED
Opiates: NOT DETECTED
Tetrahydrocannabinol: NOT DETECTED

## 2016-10-14 LAB — TYPE AND SCREEN
ABO/RH(D): O NEG
ANTIBODY SCREEN: NEGATIVE

## 2016-10-14 LAB — RPR: RPR: NONREACTIVE

## 2016-10-14 LAB — ABO/RH: ABO/RH(D): O NEG

## 2016-10-14 MED ORDER — IBUPROFEN 600 MG PO TABS
600.0000 mg | ORAL_TABLET | Freq: Four times a day (QID) | ORAL | Status: DC
  Administered 2016-10-14 – 2016-10-15 (×5): 600 mg via ORAL
  Filled 2016-10-14 (×5): qty 1

## 2016-10-14 MED ORDER — ONDANSETRON HCL 4 MG PO TABS: 4.0000 mg | ORAL_TABLET | ORAL | Status: DC | PRN

## 2016-10-14 MED ORDER — COCONUT OIL OIL: 1.0000 "application " | TOPICAL_OIL | Status: DC | PRN

## 2016-10-14 MED ORDER — TETANUS-DIPHTH-ACELL PERTUSSIS 5-2.5-18.5 LF-MCG/0.5 IM SUSP: 0.5000 mL | Freq: Once | INTRAMUSCULAR | Status: DC

## 2016-10-14 MED ORDER — LIDOCAINE HCL (PF) 1 % IJ SOLN
30.0000 mL | INTRAMUSCULAR | Status: DC | PRN
  Administered 2016-10-14: 30 mL via SUBCUTANEOUS
  Filled 2016-10-14: qty 30

## 2016-10-14 MED ORDER — WITCH HAZEL-GLYCERIN EX PADS: 1.0000 "application " | MEDICATED_PAD | CUTANEOUS | Status: DC | PRN

## 2016-10-14 MED ORDER — SOD CITRATE-CITRIC ACID 500-334 MG/5ML PO SOLN
30.0000 mL | ORAL | Status: DC | PRN
Start: 2016-10-14 — End: 2016-10-14

## 2016-10-14 MED ORDER — OXYCODONE-ACETAMINOPHEN 5-325 MG PO TABS
1.0000 | ORAL_TABLET | ORAL | Status: DC | PRN
Start: 2016-10-14 — End: 2016-10-14
  Administered 2016-10-14: 1 via ORAL
  Filled 2016-10-14: qty 1

## 2016-10-14 MED ORDER — SENNOSIDES-DOCUSATE SODIUM 8.6-50 MG PO TABS
2.0000 | ORAL_TABLET | ORAL | Status: DC
  Administered 2016-10-14: 2 via ORAL
  Filled 2016-10-14: qty 2

## 2016-10-14 MED ORDER — OXYCODONE-ACETAMINOPHEN 5-325 MG PO TABS: 2.0000 | ORAL_TABLET | ORAL | Status: DC | PRN

## 2016-10-14 MED ORDER — DIBUCAINE 1 % RE OINT: 1.0000 "application " | TOPICAL_OINTMENT | RECTAL | Status: DC | PRN

## 2016-10-14 MED ORDER — FENTANYL CITRATE (PF) 100 MCG/2ML IJ SOLN
100.0000 ug | INTRAMUSCULAR | Status: DC | PRN
  Administered 2016-10-14 (×2): 100 ug via INTRAVENOUS
  Filled 2016-10-14 (×2): qty 2

## 2016-10-14 MED ORDER — OXYTOCIN 40 UNITS IN LACTATED RINGERS INFUSION - SIMPLE MED
2.5000 [IU]/h | INTRAVENOUS | Status: DC
  Filled 2016-10-14: qty 1000

## 2016-10-14 MED ORDER — ZOLPIDEM TARTRATE 5 MG PO TABS: 5.0000 mg | ORAL_TABLET | Freq: Every evening | ORAL | Status: DC | PRN

## 2016-10-14 MED ORDER — DIPHENHYDRAMINE HCL 25 MG PO CAPS: 25.0000 mg | ORAL_CAPSULE | Freq: Four times a day (QID) | ORAL | Status: DC | PRN

## 2016-10-14 MED ORDER — OXYTOCIN BOLUS FROM INFUSION
500.0000 mL | Freq: Once | INTRAVENOUS | Status: AC
  Administered 2016-10-14: 500 mL via INTRAVENOUS

## 2016-10-14 MED ORDER — BENZOCAINE-MENTHOL 20-0.5 % EX AERO
1.0000 "application " | INHALATION_SPRAY | CUTANEOUS | Status: DC | PRN
  Administered 2016-10-14: 1 via TOPICAL
  Filled 2016-10-14: qty 56

## 2016-10-14 MED ORDER — SIMETHICONE 80 MG PO CHEW: 80.0000 mg | CHEWABLE_TABLET | ORAL | Status: DC | PRN

## 2016-10-14 MED ORDER — ONDANSETRON HCL 4 MG/2ML IJ SOLN: 4.0000 mg | INTRAMUSCULAR | Status: DC | PRN

## 2016-10-14 MED ORDER — FLEET ENEMA 7-19 GM/118ML RE ENEM: 1.0000 | ENEMA | RECTAL | Status: DC | PRN

## 2016-10-14 MED ORDER — ONDANSETRON HCL 4 MG/2ML IJ SOLN: 4.0000 mg | Freq: Four times a day (QID) | INTRAMUSCULAR | Status: DC | PRN

## 2016-10-14 MED ORDER — LACTATED RINGERS IV SOLN: 500.0000 mL | INTRAVENOUS | Status: DC | PRN

## 2016-10-14 MED ORDER — LACTATED RINGERS IV SOLN
INTRAVENOUS | Status: DC
  Administered 2016-10-14: 01:00:00 via INTRAVENOUS

## 2016-10-14 MED ORDER — PRENATAL MULTIVITAMIN CH
1.0000 | ORAL_TABLET | Freq: Every day | ORAL | Status: DC
  Administered 2016-10-14 – 2016-10-15 (×2): 1 via ORAL
  Filled 2016-10-14 (×2): qty 1

## 2016-10-14 MED ORDER — ACETAMINOPHEN 325 MG PO TABS: 650.0000 mg | ORAL_TABLET | ORAL | Status: DC | PRN

## 2016-10-14 NOTE — Lactation Note (Signed)
This note was copied from a baby's chart. Lactation Consultation Note  Patient Name: Girl Delrae RendSidney Degregory ONGEX'BToday's Date: 10/14/2016 Reason for consult: Initial assessment   P1, Baby 8 hours old.  Baby is latched in cross cradle position. Mother states RN assisted w/ latching and hand expression. Intermittent sucks and swallows observed. Discussed basics.  Mom encouraged to feed baby 8-12 times/24 hours and with feeding cues.  Mom made aware of O/P services, breastfeeding support groups, community resources, and our phone # for post-discharge questions.     Maternal Data Has patient been taught Hand Expression?: Yes Does the patient have breastfeeding experience prior to this delivery?: No  Feeding Feeding Type: Breast Fed  LATCH Score Latch: Grasps breast easily, tongue down, lips flanged, rhythmical sucking.  Audible Swallowing: Spontaneous and intermittent  Type of Nipple: Flat  Comfort (Breast/Nipple): Soft / non-tender  Hold (Positioning): Assistance needed to correctly position infant at breast and maintain latch.  LATCH Score: 8  Interventions Interventions: Breast feeding basics reviewed;Skin to skin;Breast massage  Lactation Tools Discussed/Used     Consult Status Consult Status: Follow-up Date: 10/15/16 Follow-up type: In-patient    Dahlia ByesBerkelhammer, Ruth [facility] 10/14/2016, 12:54 PM

## 2016-10-14 NOTE — Progress Notes (Signed)
UR chart review completed.  

## 2016-10-14 NOTE — MAU Note (Signed)
Pt c/o contractions since 0800 yesterday morning, no report of vag bleeding or leaking; states baby is moving well.

## 2016-10-14 NOTE — H&P (Signed)
LABOR AND DELIVERY ADMISSION HISTORY AND PHYSICAL NOTE  Pamela Mitchell is a 21 y.o. female G1P0 with IUP at [redacted]w[redacted]d by 13-week U/S presenting for SOL.  She reports positive fetal movement. She denies leakage of fluid or vaginal bleeding.  Prenatal History/Complications: PNC at [facility] Complications: - H/o marijuana use: last reported use Jan 2018; +UDS 03/21/16 at [facility]; neg UDS on 07/25/16 - RH negative  Past Medical History: Past Medical History:  Diagnosis Date  . Medical history non-contributory     Past Surgical History: Past Surgical History:  Procedure Laterality Date  . NO PAST SURGERIES      Obstetrical History: OB History    Gravida Para Term Preterm AB Living   1         0   SAB TAB Ectopic Multiple Live Births                  Social History: Social History   Social History  . Marital status: Single    Spouse name: N/A  . Number of children: N/A  . Years of education: N/A   Social History Main Topics  . Smoking status: Former Smoker    Quit date: 02/16/2016  . Smokeless tobacco: Never Used  . Alcohol use No  . Drug use: Yes    Types: Marijuana     Comment: 02/15/16 last time used  . Sexual activity: Yes    Birth control/ protection: None   Other Topics Concern  . None   Social History Narrative  . None    Family History: History reviewed. No pertinent family history.  Allergies: No Known Allergies  Prescriptions Prior to Admission  Medication Sig Dispense Refill Last Dose  . acetaminophen (TYLENOL) 500 MG tablet Take 1,000 mg by mouth every 6 (six) hours as needed for mild pain or fever.   Past Month at Unknown time  . amoxicillin (AMOXIL) 500 MG capsule Take 1 capsule (500 mg total) by mouth 3 (three) times daily. 21 capsule 0   . cetirizine (ZYRTEC) 10 MG tablet Take 1 tablet (10 mg total) by mouth daily. 30 tablet 1   . guaiFENesin (MUCINEX) 600 MG 12 hr tablet Take 1 tablet (600 mg total) by mouth 2 (two) times daily. 30 tablet 1   . Prenatal  Vit-Fe Fumarate-FA (PRENATAL MULTIVITAMIN) TABS tablet Take 1 tablet by mouth daily at 12 noon.   06/07/2016 at Unknown time     Review of Systems  All systems reviewed and negative except as stated in HPI  Physical Exam Blood pressure 131/89, pulse (!) 101, temperature 98.3 F (36.8 C), temperature source Oral, resp. rate 16, height  (1.549 m), weight 154 lb (69.9 kg), last menstrual period 01/07/2016. General appearance: alert and mild distress with contractions Lungs: normal WOB Heart: regular rate  Abdomen: soft, non-tender; Extremities: No calf swelling or tenderness Presentation: cephalic by SVE Fetal monitoring: 135, moderate variability, +acel, no devel Uterine activity: ctx q1-4 min Dilation: 5 Effacement (%): 100 Station: -2 Exam by:: Domenic Polite   Prenatal labs: ABO, Rh:  O negative Antibody:  negative Rubella: immune Varicella: immune RPR:   negative HBsAg:   negative HIV:   nonreactive GC/Chlamydia: negative GBS:   negative 1 hr Glucola: 110 Genetic screening:  Negative Anatomy US: normal  Prenatal Transfer Tool  Maternal Diabetes: No Genetic Screening: Normal Maternal Ultrasounds/Referrals: Normal Fetal Ultrasounds or other Referrals:  None Maternal Substance Abuse:  Yes:  Type: Marijuana Significant Maternal Medications:  None Significant Maternal  Lab Results: Lab values include: Other: +UDS for marijuana on 03/21/16   Assessment: Pamela Mitchell is a [age] y.o. G1P0 at 3150w1d here for SOL.  #Labor: Expectant management #Pain: Not planning on epidural. IV pain meds prn #FWB: Cat I #ID:  GBS neg #MOF: breast #MOC:OCP #Circ:  N/a (girl)  Kandra NicolasJulie P Patryck Kilgore 10/14/2016, 1:38 AM

## 2016-10-15 LAB — BIRTH TISSUE RECOVERY COLLECTION (PLACENTA DONATION)

## 2016-10-15 MED ORDER — NORETHINDRONE 0.35 MG PO TABS: 1.0000 | ORAL_TABLET | Freq: Every day | ORAL | 11 refills | Status: DC

## 2016-10-15 NOTE — Progress Notes (Signed)
CSW received consult for hx of marijuana use.  Referral was screened out due to the following: ~MOB had no documented substance use after initial prenatal visit/+UPT. ~MOB had no positive drug screens after initial prenatal visit/+UPT. ~Baby's UDS is negative.  Please consult CSW if current concerns arise or by MOB's request.  CSW will monitor CDS results and make report to Child Protective Services if warranted. 

## 2016-10-15 NOTE — Lactation Note (Signed)
This note was copied from a baby's chart. Lactation Consultation Note: Mother reports that infant just finished a 30 min feeding. Assist mother with hand expression and observed good flow of colostrum drops. Mother denies feeling pain when infant latches . Mother reports that her breast are becoming heavy. Advised mother to continue to cue base feed and feed at least 8-12 times in 24 hours. Mother advised to allow father to do frequent skin to skin to aid in soothing infant. Mother given plan of care for prevention and treatment of engorgement.. Mother hand hand pump at the bedside. She was advised to use for several mins piror to latching as needed. Mother informed of available LC/OP dept and encouraged to call to schedule an appt. Discussed limiting the use of pacifier. Discussed cluster feeding . Mother receptive to all teaching.   Patient Name: Girl Delrae RendSidney Tulloch NWGNF'AToday's Date: 10/15/2016 Reason for consult: Follow-up assessment   Maternal Data    Feeding Feeding Type: Breast Fed Length of feed: 30 min  LATCH Score                   Interventions Interventions: Assisted with latch;Breast feeding basics reviewed;Support pillows;Adjust position;Hand express;Position options  Lactation Tools Discussed/Used     Consult Status Consult Status: Complete    Michel BickersKendrick, Angeletta Goelz McCoy 10/15/2016, 9:53 AM

## 2016-10-15 NOTE — Discharge Summary (Signed)
OB Discharge Summary     Patient Name: Pamela Mitchell DOB: 1995-10-11 MRN: [id number]  Date of admission: 10/14/2016 Delivering MD: Frederik Pear   Date of discharge: 10/15/2016  Admitting diagnosis: 40 WEEKS CTX Intrauterine pregnancy: [redacted]w[redacted]d     Secondary diagnosis:  Principal Problem:   Indication for care in labor or delivery Active Problems:   Normal labor   History of marijuana use  Additional problems: none     Discharge diagnosis: Term Pregnancy Delivered                                                                                                Post partum procedures:rhogam  Augmentation: none  Complications: None  Hospital course:  Onset of Labor With Vaginal Delivery     21 y.o. yo G1P1001 at [redacted]w[redacted]d was admitted in Active Labor on 10/14/2016. Patient had an uncomplicated labor course as follows:  Membrane Rupture Time/Date: 3:23 AM ,10/14/2016   Intrapartum Procedures: Episiotomy: None [1]                                         Lacerations:  Perineal [11];2nd degree [3]  Patient had a delivery of a Viable infant. 10/14/2016  Information for the patient's newborn:  Moncia, Annas Girl Maicie [[id number]]       Pateint had an uncomplicated postpartum course.  She is ambulating, tolerating a regular diet, passing flatus, and urinating well. Patient is discharged home in stable condition on 10/15/16.   Physical exam  Vitals:   10/14/16 0810 10/14/16 1210 10/14/16 2000 10/15/16 0601  BP: (!) 110/57 119/67 122/74 116/75  Pulse: 95 (!) 111 (!) 106 77  Resp: Temp: 98.6 F (37 C) 98.4 F (36.9 C) 98.1 F (36.7 C) 98.6 F (37 C)  TempSrc: Oral Oral Oral Oral  Weight:      Height:       General: alert and cooperative Lochia: appropriate Uterine Fundus: firm Incision: N/A DVT Evaluation: No evidence of DVT seen on physical exam. Labs: Lab Results  Component Value Date   WBC 15.9 (H) 10/14/2016   HGB 12.6 10/14/2016   HCT 37.9 10/14/2016   MCV  85.4 10/14/2016   PLT 173 10/14/2016   No flowsheet data found.  Discharge instruction: per After Visit Summary and "Baby and Me Booklet".  After visit meds:  Allergies as of 10/15/2016   No Known Allergies     Medication List    TAKE these medications   acetaminophen 500 MG tablet Commonly known as:  TYLENOL Take 1,000 mg by mouth every 6 (six) hours as needed for mild pain or fever.   prenatal multivitamin Tabs tablet Take 1 tablet by mouth daily at 12 noon.            Discharge Care Instructions        Start     Ordered   10/14/16 0000  OB RESULT CONSOLE Group B Strep    Comments:  This external  order was created through the Results Console.   10/14/16 0559   10/14/16 0000  OB RESULTS CONSOLE GC/Chlamydia    Comments:  This external order was created through the Results Console.   10/14/16 0559   10/14/16 0000  OB RESULTS CONSOLE RPR    Comments:  This external order was created through the Results Console.    10/14/16 0559   10/14/16 0000  OB RESULTS CONSOLE HIV antibody    Comments:  This external order was created through the Results Console.    10/14/16 0559   10/14/16 0000  OB RESULTS CONSOLE Rubella Antibody    Comments:  This external order was created through the Results Console.    10/14/16 0559   10/14/16 0000  OB RESULTS CONSOLE Hepatitis B surface antigen    Comments:  This external order was created through the Results Console.    10/14/16 0559   10/14/16 0000  OB RESULTS CONSOLE ABO/Rh    Comments:  This external order was created through the Results Console.    10/14/16 0559   10/14/16 0000  OB RESULTS CONSOLE Antibody Screen    Comments:  This external order was created through the Results Console.    10/14/16 0559      Diet: routine diet  Activity: Advance as tolerated. Pelvic rest for 6 weeks.   Outpatient follow up:6 weeks Follow up Appt:No future appointments. Follow up Visit:No Follow-up on file.  Postpartum  contraception: Progesterone only pills  Newborn Data: Live born female  Birth Weight: 7 lb 6.7 oz (3365 g) APGAR: 9, 9  Baby Feeding: Breast Disposition:home with mother   10/15/2016 Marylene LandKathryn Lorraine Kooistra, CNM

## 2017-03-14 IMAGING — US US MFM FETAL NUCHAL TRANSLUCENCY
1 series · 15 of 28 positions shown · non-contrast
Comparison: none

[Series 1: us mfm fetal nuchal translucency · 42 acquisitions, 15 frames shown]
[im 1/42]
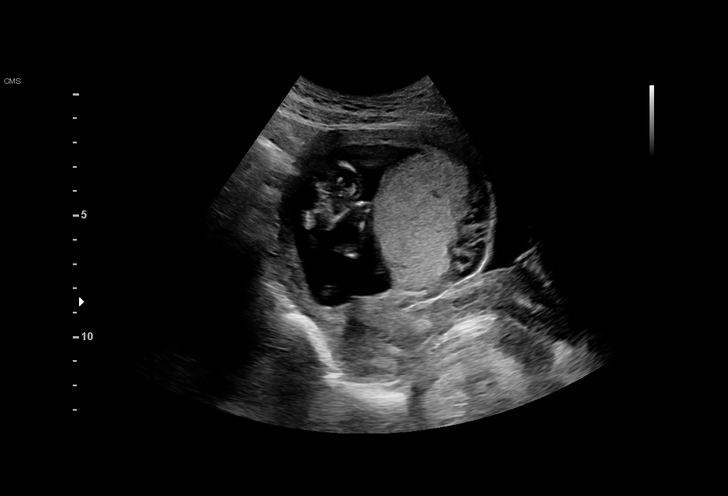
[im 4/42]
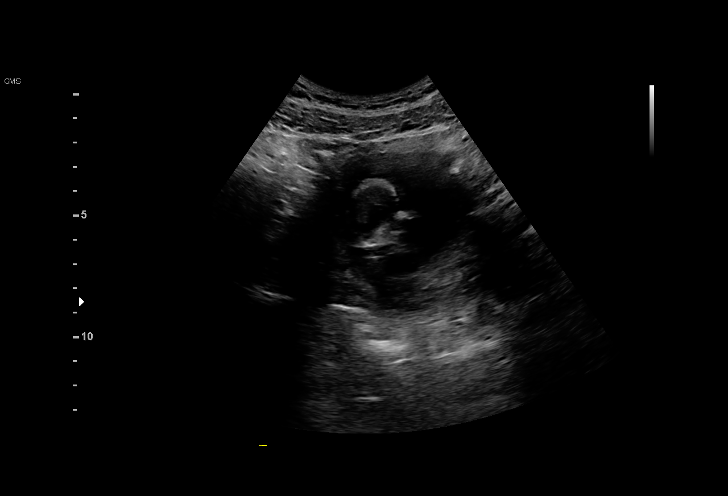
[im 7/42]
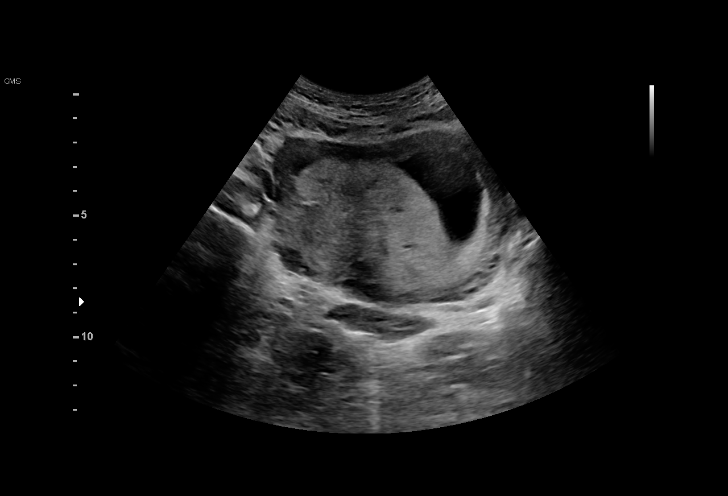
[im 10/42]
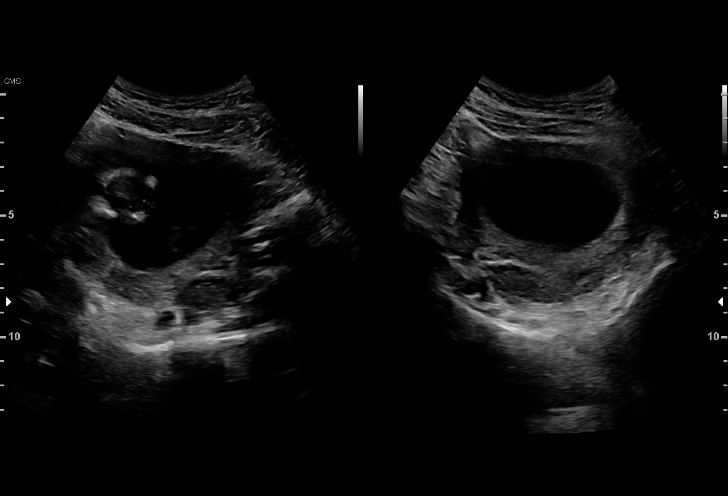
[im 13/42]
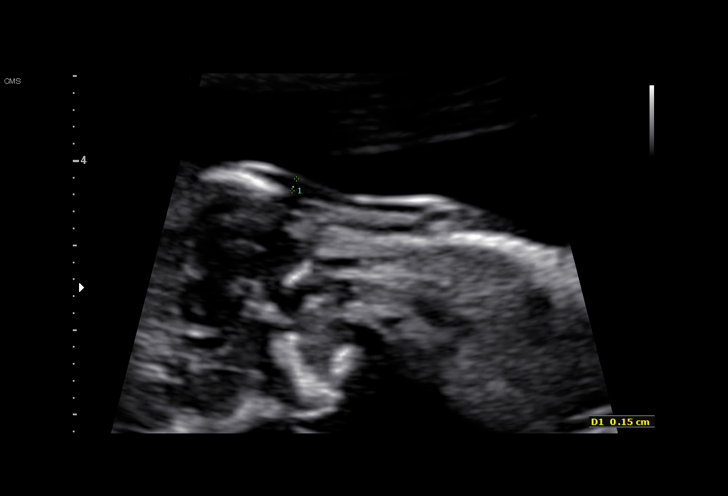
[im 16/42]
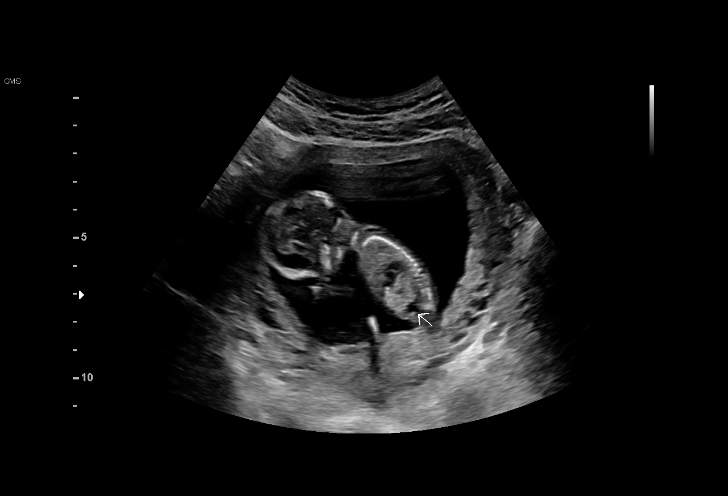
[im 19/42]
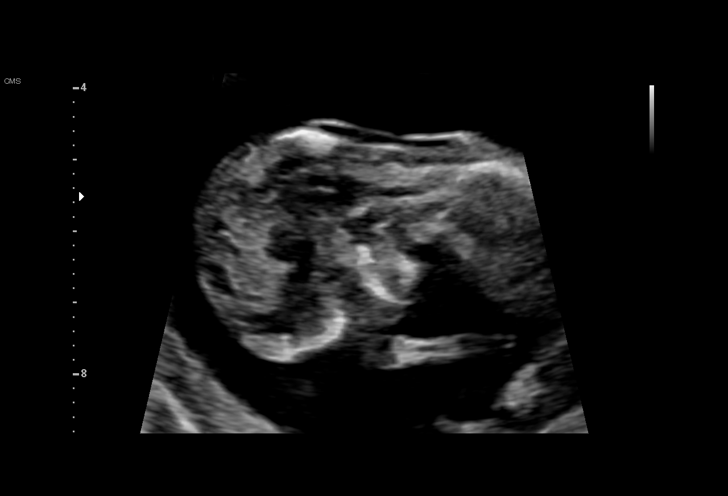
[im 22/42]
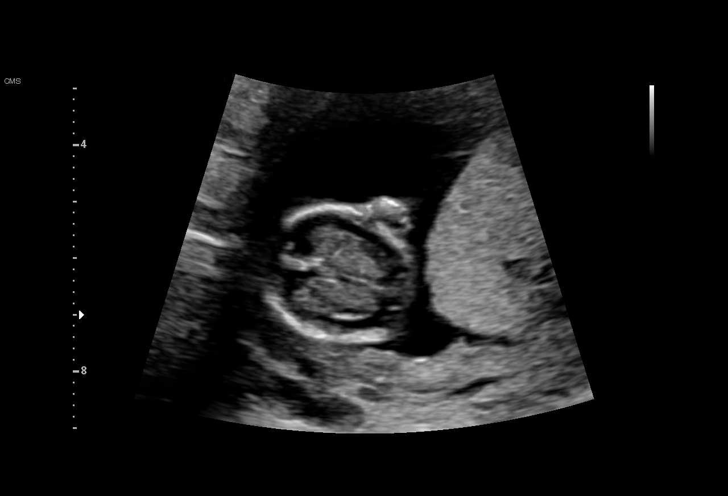
[im 23/42]
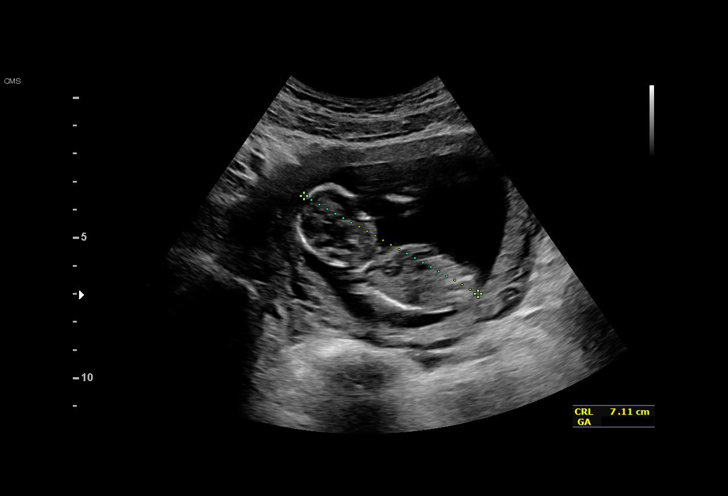
[im 26/42]
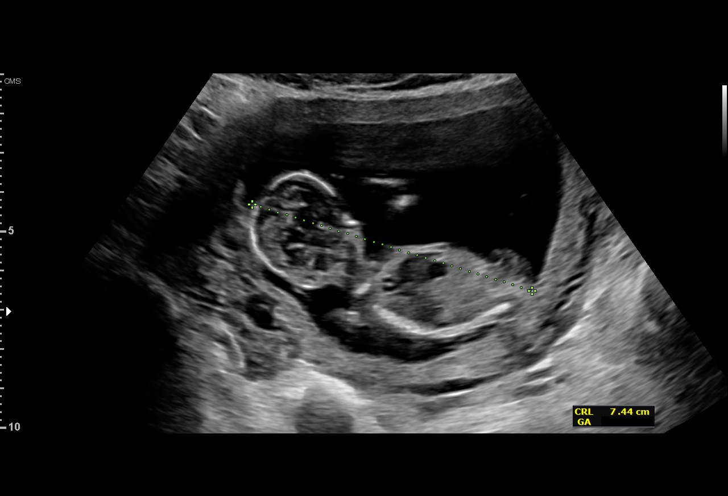
[im 29/42]
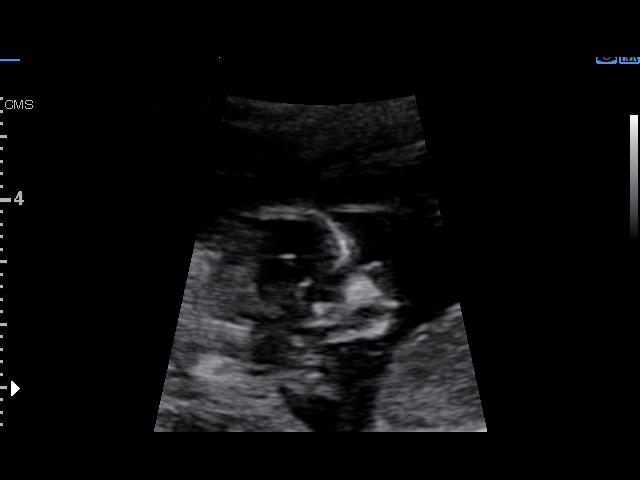
[im 32/42]
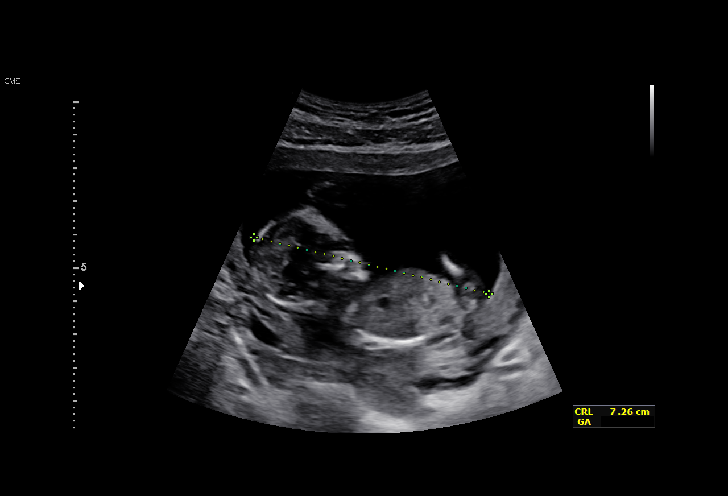
[im 35/42]
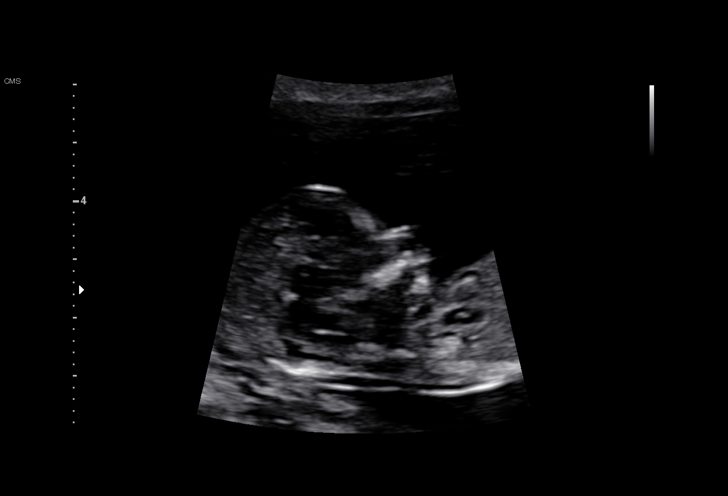
[im 38/42]
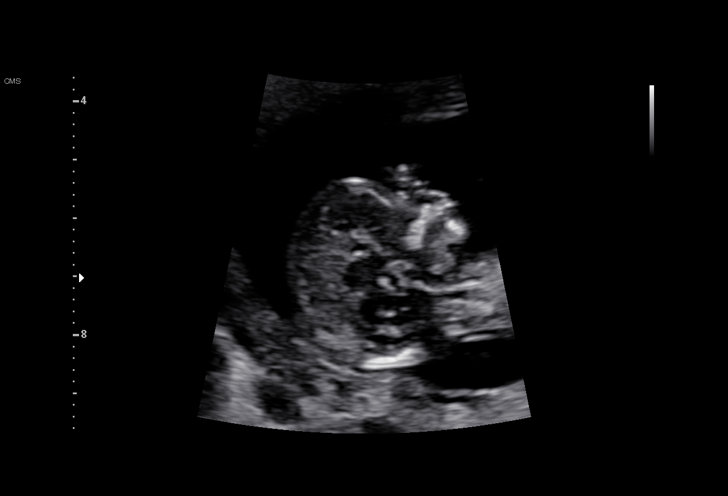
[im 42/42]
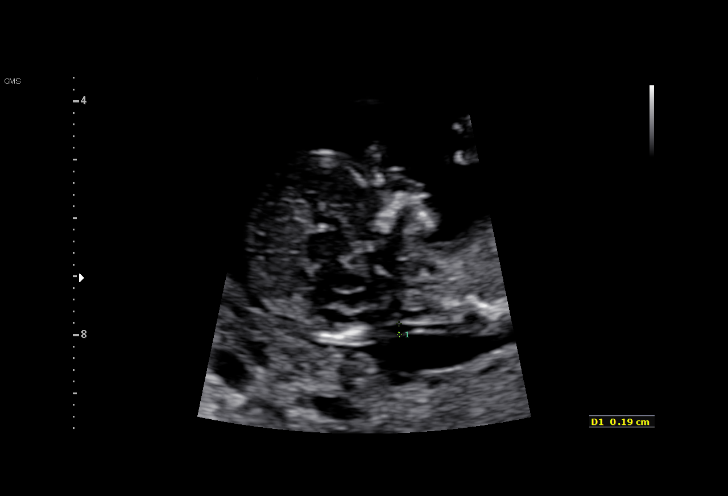

[15 of 28 positions shown; findings below may reference images not displayed]

[REDACTED]-
Faculty Physician

TRANSLUCENCY

1  CHINIE KILO           [id number]      [id number]     [id number]
Indications

13 weeks gestation of pregnancy
Encounter for nuchal translucency
OB History

Gravidity:    1         Term:   0        Prem:   0        SAB:   0
TOP:          0       Ectopic:  0        Living: 0
Fetal Evaluation

Num Of Fetuses:     1
Fetal Heart         165
Rate(bpm):
Cardiac Activity:   Observed
Gestational Age

LMP:           13w 1d       Date:   01/07/16                 EDD:   10/13/16
Best:          13w 1d    Det. By:   LMP  (01/07/16)          EDD:   10/13/16
1st Trimester Genetic Sonogram Screening

CRL:            72.7  mm    G. Age:   13w 1d                 EDD:   10/13/16
Nuc Trans:       1.9  mm

Nasal Bone:                 Present
Anatomy

Cranium:               Appears normal         Stomach:                Appears normal, left
sided
Choroid Plexus:        Appears normal         Bladder:                Appears normal
Cervix Uterus Adnexa

Cervix
Closed

Uterus
No abnormality visualized.

Left Ovary
Within normal limits.

Right Ovary
Within normal limits.

Cul De Sac:   No free fluid seen.

Adnexa:       No abnormality visualized.
Impression

SIUP at 13+1 weeks
No gross abnormalities identified
NT measurement was within normal limits for this GA; NB
present
Normal amniotic fluid volume
Measurements consistent with LMP dating
Recommendations

Offer MSAFP in the second trimester for ONTD screening
Offer anatomy U/S by 18 weeks

## 2018-11-18 ENCOUNTER — Other Ambulatory Visit: Payer: Self-pay

## 2018-11-18 DIAGNOSIS — Z20822 Contact with and (suspected) exposure to covid-19: Secondary | ICD-10-CM

## 2018-11-19 LAB — NOVEL CORONAVIRUS, NAA: SARS-CoV-2, NAA: NOT DETECTED

## 2018-12-07 ENCOUNTER — Other Ambulatory Visit: Payer: Self-pay

## 2018-12-07 DIAGNOSIS — Z20822 Contact with and (suspected) exposure to covid-19: Secondary | ICD-10-CM

## 2018-12-08 LAB — NOVEL CORONAVIRUS, NAA: SARS-CoV-2, NAA: NOT DETECTED

## 2019-01-08 ENCOUNTER — Other Ambulatory Visit: Payer: Self-pay

## 2019-01-08 DIAGNOSIS — Z20822 Contact with and (suspected) exposure to covid-19: Secondary | ICD-10-CM

## 2019-01-11 LAB — NOVEL CORONAVIRUS, NAA: SARS-CoV-2, NAA: NOT DETECTED

## 2019-04-19 ENCOUNTER — Other Ambulatory Visit: Payer: Self-pay

## 2019-04-19 ENCOUNTER — Ambulatory Visit: Payer: Medicaid Other | Attending: Internal Medicine

## 2019-04-19 DIAGNOSIS — Z20822 Contact with and (suspected) exposure to covid-19: Secondary | ICD-10-CM

## 2019-04-20 LAB — NOVEL CORONAVIRUS, NAA: SARS-CoV-2, NAA: NOT DETECTED

## 2019-08-10 ENCOUNTER — Other Ambulatory Visit: Payer: Self-pay

## 2019-08-10 ENCOUNTER — Encounter: Payer: Self-pay | Admitting: Nurse Practitioner

## 2019-08-10 ENCOUNTER — Other Ambulatory Visit (HOSPITAL_COMMUNITY)
Admission: RE | Admit: 2019-08-10 | Discharge: 2019-08-10 | Disposition: A | Discharge status: Home / Self Care | Payer: Medicaid Other | Source: Ambulatory Visit | Attending: Nurse Practitioner | Admitting: Nurse Practitioner

## 2019-08-10 ENCOUNTER — Ambulatory Visit (INDEPENDENT_AMBULATORY_CARE_PROVIDER_SITE_OTHER): Payer: Medicaid Other | Admitting: Nurse Practitioner

## 2019-08-10 VITALS — BP 106/69 | HR 73 | Ht 61.0 in | Wt 115.3 lb

## 2019-08-10 DIAGNOSIS — Z113 Encounter for screening for infections with a predominantly sexual mode of transmission: Secondary | ICD-10-CM | POA: Insufficient documentation

## 2019-08-10 DIAGNOSIS — Z01419 Encounter for gynecological examination (general) (routine) without abnormal findings: Secondary | ICD-10-CM | POA: Diagnosis present

## 2019-08-10 DIAGNOSIS — Z30011 Encounter for initial prescription of contraceptive pills: Secondary | ICD-10-CM

## 2019-08-10 DIAGNOSIS — Z309 Encounter for contraceptive management, unspecified: Secondary | ICD-10-CM | POA: Insufficient documentation

## 2019-08-10 MED ORDER — LEVONORGESTREL-ETHINYL ESTRAD 0.15-30 MG-MCG PO TABS: 1.0000 | ORAL_TABLET | Freq: Every day | ORAL | 2 refills | Status: DC

## 2019-08-10 NOTE — Progress Notes (Signed)
GYNECOLOGY ANNUAL PREVENTATIVE CARE ENCOUNTER NOTE  Subjective:   Pamela Mitchell is a 24 y.o. G75P1001 female here for a routine annual gynecologic exam.  Current complaints: Wants to restart her pills - ran out.   Denies abnormal vaginal bleeding, discharge, pelvic pain, problems with intercourse or other gynecologic concerns.    Gynecologic History Patient's last menstrual period was 08/03/2019 (approximate). Contraception: OCP (estrogen/progesterone) Last Pap: never had  Obstetric History OB History  Gravida Para Term Preterm AB Living  1 1 1     1   SAB TAB Ectopic Multiple Live Births        0 1    # Outcome Date GA Lbr Len/2nd Weight Sex Delivery Anes PTL Lv  1 Term 10/14/16 [redacted]w[redacted]d 06:59 / 00:55 7 lb 6.7 oz (3.365 kg) F Vag-Spont Local  LIV    Past Medical History:  Diagnosis Date   Medical history non-contributory     Past Surgical History:  Procedure Laterality Date   NO PAST SURGERIES      Current Outpatient Medications on File Prior to Visit  Medication Sig Dispense Refill   acetaminophen (TYLENOL) 500 MG tablet Take 1,000 mg by mouth every 6 (six) hours as needed for mild pain or fever.     No current facility-administered medications on file prior to visit.    No Known Allergies  Social History   Socioeconomic History   Marital status: Single    Spouse name: Not on file   Number of children: Not on file   Years of education: Not on file   Highest education level: Not on file  Occupational History   Not on file  Tobacco Use   Smoking status: Former Smoker    Quit date: 02/16/2016    Years since quitting: 3.4   Smokeless tobacco: Never Used  Substance and Sexual Activity   Alcohol use: No   Drug use: Yes    Types: Marijuana    Comment: 02/15/16 last time used   Sexual activity: Yes    Birth control/protection: None  Other Topics Concern   Not on file  Social History Narrative   Not on file   Social Determinants of Health    Financial Resource Strain:    Difficulty of Paying Living Expenses:   Food Insecurity:    Worried About 04/14/16 in the Last Year:    Programme researcher, broadcasting/film/video in the Last Year:   Transportation Needs:    Barista (Medical):    Lack of Transportation (Non-Medical):   Physical Activity:    Days of Exercise per Week:    Minutes of Exercise per Session:   Stress:    Feeling of Stress :   Social Connections:    Frequency of Communication with Friends and Family:    Frequency of Social Gatherings with Friends and Family:    Attends Religious Services:    Active Member of Clubs or Organizations:    Attends Freight forwarder:    Marital Status:   Intimate Partner Violence:    Fear of Current or Ex-Partner:    Emotionally Abused:    Physically Abused:    Sexually Abused:     History reviewed. No pertinent family history.  The following portions of the patient's history were reviewed and updated as appropriate: allergies, current medications, past family history, past medical history, past social history, past surgical history and problem list.  Review of Systems Pertinent items noted in HPI and  remainder of comprehensive ROS otherwise negative.   Objective:  BP 106/69    Pulse 73    Ht 5\' 1"  (1.549 m)    Wt 115 lb 4.8 oz (52.3 kg)    LMP 08/03/2019 (Approximate)    BMI 21.79 kg/m  CONSTITUTIONAL: Well-developed, well-nourished female in no acute distress.  HENT:  Normocephalic, atraumatic, External right and left ear normal.  EYES: Conjunctivae and EOM are normal. Pupils are equal, round.  No scleral icterus.  NECK: Normal range of motion, supple, no masses.  Normal thyroid.  SKIN: Skin is warm and dry. No rash noted. Not diaphoretic. No erythema. No pallor. NEUROLOGIC: Alert and oriented to person, place, and time. Normal reflexes, muscle tone coordination. No cranial nerve deficit noted. PSYCHIATRIC: Normal mood and affect. Normal  behavior. Normal judgment and thought content. CARDIOVASCULAR: Normal heart rate noted, regular rhythm RESPIRATORY: Clear to auscultation bilaterally. Effort and breath sounds normal, no problems with respiration noted. BREASTS: Symmetric in size. No masses, skin changes, nipple drainage, or lymphadenopathy. ABDOMEN: Soft, no distention noted.  No tenderness, rebound or guarding.  PELVIC: Normal appearing external genitalia; normal appearing vaginal mucosa and cervix.  No abnormal discharge noted.  Pap smear obtained.  Normal uterine size, no other palpable masses, no uterine or adnexal tenderness. MUSCULOSKELETAL: Normal range of motion. No tenderness.  No cyanosis, clubbing, or edema.    Assessment and Plan:  1. Encounter for annual routine gynecological examination  2. Gynecologic exam normal  - Cytology - PAP( [place])  3. Screening for STD (sexually transmitted disease)  - Cytology - PAP( [place]) - HIV Antibody (routine testing w rflx) - RPR - Hepatitis B Surface AntiGEN - Hepatitis C Antibody  4. Encounter for BCP (birth control pills) initial prescription Pills prescribed and to begin taking daily today.  Was having mood swings with 35 mcg pills so will try a lower dose of estrogen to see if pills are better tolerated without mood swings.  - levonorgestrel-ethinyl estradiol (NORDETTE) 0.15-30 MG-MCG tablet; Take 1 tablet by mouth daily.  Dispense: 28 tablet; Refill: 2  Will follow up results of pap smear and manage accordingly. Routine preventative health maintenance measures emphasized. Please refer to After Visit Summary for other counseling recommendations.    06-13-2003, RN, MSN, NP-BC Nurse Practitioner, [facility] for [facility]

## 2019-08-10 NOTE — Patient Instructions (Signed)
Oral Contraception Use Oral contraceptive pills (OCPs) are medicines that you take to prevent pregnancy. OCPs work by:  Preventing the ovaries from releasing eggs.  Thickening mucus in the lower part of the uterus (cervix), which prevents sperm from entering the uterus.  Thinning the lining of the uterus (endometrium), which prevents a fertilized egg from attaching to the endometrium. OCPs are highly effective when taken exactly as prescribed. However, OCPs do not prevent sexually transmitted infections (STIs). Safe sex practices, such as using condoms while on an OCP, can help prevent STIs. Before taking OCPs, you may have a physical exam, blood test, and Pap test. A Pap test involves taking a sample of cells from your cervix to check for cancer. Discuss with your health care provider the possible side effects of the OCP you may be prescribed. When you start an OCP, be aware that it can take 2-3 months for your body to adjust to changes in hormone levels. How to take oral contraceptive pills Follow instructions from your health care provider about how to start taking your first cycle of OCPs. Your health care provider may recommend that you:  Start the pill on day 1 of your menstrual period. If you start at this time, you will not need any backup form of birth control (contraception), such as condoms.  Start the pill on the first Sunday after your menstrual period or on the day you get your prescription. In these cases, you will need to use backup contraception for the first week.  Start the pill at any time of your cycle. ? If you take the pill within 5 days of the start of your period, you will not need a backup form of contraception. ? If you start at any other time of your menstrual cycle, you will need to use another form of contraception for 7 days. If your OCP is the type called a minipill, it will protect you from pregnancy after taking it for 2 days (48 hours), and you can stop using  backup contraception after that time. After you have started taking OCPs:  If you forget to take 1 pill, take it as soon as you remember. Take the next pill at the regular time.  If you miss 2 or more pills, call your health care provider. Different pills have different instructions for missed doses. Use backup birth control until your next menstrual period starts.  If you use a 28-day pack that contains inactive pills and you miss 1 of the last 7 pills (pills with no hormones), throw away the rest of the non-hormone pills and start a new pill pack. No matter which day you start the OCP, you will always start a new pack on that same day of the week. Have an extra pack of OCPs and a backup contraceptive method available in case you miss some pills or lose your OCP pack. Follow these instructions at home:  Do not use any products that contain nicotine or tobacco, such as cigarettes and e-cigarettes. If you need help quitting, ask your health care provider.  Always use a condom to protect against STIs. OCPs do not protect against STIs.  Use a calendar to mark the days of your menstrual period.  Read the information and directions that came with your OCP. Talk to your health care provider if you have questions. Contact a health care provider if:  You develop nausea and vomiting.  You have abnormal vaginal discharge or bleeding.  You develop a rash.    You miss your menstrual period. Depending on the type of OCP you are taking, this may be a sign of pregnancy. Ask your health care provider for more information.  You are losing your hair.  You need treatment for mood swings or depression.  You get dizzy when taking the OCP.  You develop acne after taking the OCP.  You become pregnant or think you may be pregnant.  You have diarrhea, constipation, and abdominal pain or cramps.  You miss 2 or more pills. Get help right away if:  You develop chest pain.  You develop shortness of  breath.  You have an uncontrolled or severe headache.  You develop numbness or slurred speech.  You develop visual or speech problems.  You develop pain, redness, and swelling in your legs.  You develop weakness or numbness in your arms or legs. Summary  Oral contraceptive pills (OCPs) are medicines that you take to prevent pregnancy.  OCPs do not prevent sexually transmitted infections (STIs). Always use a condom to protect against STIs.  When you start an OCP, be aware that it can take 2-3 months for your body to adjust to changes in hormone levels.  Read all the information and directions that come with your OCP. This information is not intended to replace advice given to you by your health care provider. Make sure you discuss any questions you have with your health care provider. Document Revised: 05/15/2018 Document Reviewed: 03/04/2016 Elsevier Patient Education  2020 [employer]  

## 2019-08-11 LAB — HEPATITIS C ANTIBODY: Hep C Virus Ab: <0.1 s/co ratio (ref 0.0–0.9)

## 2019-08-11 LAB — HEPATITIS B SURFACE ANTIGEN: Hepatitis B Surface Ag: NEGATIVE

## 2019-08-11 LAB — HIV ANTIBODY (ROUTINE TESTING W REFLEX): HIV Screen 4th Generation wRfx: NONREACTIVE

## 2019-08-11 LAB — RPR: RPR Ser Ql: NONREACTIVE

## 2019-08-12 LAB — CYTOLOGY - PAP
Chlamydia: NEGATIVE
Comment: NEGATIVE
Comment: NEGATIVE
Comment: NORMAL
Neisseria Gonorrhea: NEGATIVE
Trichomonas: NEGATIVE

## 2019-08-17 ENCOUNTER — Encounter: Payer: Self-pay | Admitting: Nurse Practitioner

## 2019-08-17 DIAGNOSIS — R87612 Low grade squamous intraepithelial lesion on cytologic smear of cervix (LGSIL): Secondary | ICD-10-CM | POA: Insufficient documentation

## 2019-10-27 ENCOUNTER — Ambulatory Visit: Payer: Medicaid Other

## 2019-10-29 ENCOUNTER — Ambulatory Visit (INDEPENDENT_AMBULATORY_CARE_PROVIDER_SITE_OTHER): Payer: Medicaid Other | Admitting: *Deleted

## 2019-10-29 ENCOUNTER — Other Ambulatory Visit: Payer: Self-pay

## 2019-10-29 ENCOUNTER — Encounter: Payer: Self-pay | Admitting: *Deleted

## 2019-10-29 VITALS — BP 114/80 | HR 73 | Ht 61.0 in | Wt 118.6 lb

## 2019-10-29 DIAGNOSIS — Z3041 Encounter for surveillance of contraceptive pills: Secondary | ICD-10-CM

## 2019-10-29 DIAGNOSIS — Z0130 Encounter for examination of blood pressure without abnormal findings: Secondary | ICD-10-CM | POA: Diagnosis not present

## 2019-10-29 MED ORDER — LEVONORGESTREL-ETHINYL ESTRAD 0.15-30 MG-MCG PO TABS: 1.0000 | ORAL_TABLET | Freq: Every day | ORAL | 11 refills | Status: DC

## 2019-10-29 NOTE — Progress Notes (Signed)
Pt presents as scheduled for BP check and refill of OCP's.  Pt had previously reported mood swings and therefore OCP Rx with lower estrogen was given on 08/10/19 @ Annual exam. She states she has been taking OCP's although sometimes forgets and misses one or more days in a row. We had a lengthy discussion regarding the importance of taking her pills regularly and that she can double up on the pills following a missed dose. Pt was cautioned that this should not be done multiple times during a month and also she should then use condoms as a back up method for pregnancy prevention. She has noticed improvement with mood swings since taking the lower estrogen pills. Pt  was advised of availability for [facility] from our office if desired in the future.  BP - 114/80. P - 73. Rx for 11 months of pills sent to pharmacy. Pt was reminded that she will need next Annual Gyn exam w/pap in July or August 2022. Pt voiced understanding of all information and instructions given.

## 2019-10-29 NOTE — Addendum Note (Signed)
Addended by: Jill Side on: 10/29/2019 11:36 AM   Modules accepted: Orders

## 2019-10-29 NOTE — Addendum Note (Signed)
Addended by: Jill Side on: 10/29/2019 11:38 AM   Modules accepted: Orders

## 2019-10-29 NOTE — Progress Notes (Signed)
Patient seen and assessed by nursing staff.  Agree with documentation and plan.  

## 2019-11-09 ENCOUNTER — Encounter: Payer: Self-pay | Admitting: *Deleted

## 2019-11-10 ENCOUNTER — Ambulatory Visit: Payer: Medicaid Other

## 2019-12-21 ENCOUNTER — Ambulatory Visit: Payer: Medicaid Other | Admitting: Nurse Practitioner

## 2020-01-31 ENCOUNTER — Other Ambulatory Visit: Payer: Self-pay

## 2020-01-31 ENCOUNTER — Ambulatory Visit (INDEPENDENT_AMBULATORY_CARE_PROVIDER_SITE_OTHER): Payer: Medicaid Other | Admitting: Lactation Services

## 2020-01-31 ENCOUNTER — Encounter: Payer: Self-pay | Admitting: Lactation Services

## 2020-01-31 ENCOUNTER — Other Ambulatory Visit (HOSPITAL_COMMUNITY)
Admission: RE | Admit: 2020-01-31 | Discharge: 2020-01-31 | Disposition: A | Discharge status: Home / Self Care | Payer: Medicaid Other | Source: Ambulatory Visit | Attending: Family Medicine | Admitting: Family Medicine

## 2020-01-31 VITALS — BP 108/67 | HR 76 | Ht 61.0 in | Wt 115.6 lb

## 2020-01-31 DIAGNOSIS — A64 Unspecified sexually transmitted disease: Secondary | ICD-10-CM | POA: Insufficient documentation

## 2020-01-31 NOTE — Addendum Note (Signed)
Addended by: Ed Blalock on: 01/31/2020 03:28 PM   Modules accepted: Orders

## 2020-01-31 NOTE — Progress Notes (Signed)
Patient was assessed and managed by nursing staff during this encounter. I have reviewed the chart and agree with the documentation and plan.  Sharyon Cable, CNM 01/31/2020 4:49 PM

## 2020-01-31 NOTE — Progress Notes (Signed)
Patient reports she would like to get tested for STI's. She declines HIV, Hep B, Hep C and RPR. She is not aware of any exposure to STI's. She has no vaginal discharge or other concerns. Self swab obtained. Reviewed with patient that results will be back in about 48 hours and she will be contacted for any abnormal results. Patient voiced understanding.   Patient reports she stopped OCP's as she was having mood swings and depression. She would like to possibly try the patch. Asked her to schedule virtual visit with provider to discuss birth control options. Patient voiced understanding.

## 2020-02-01 ENCOUNTER — Telehealth (INDEPENDENT_AMBULATORY_CARE_PROVIDER_SITE_OTHER): Payer: Medicaid Other | Admitting: Obstetrics and Gynecology

## 2020-02-01 ENCOUNTER — Encounter: Payer: Self-pay | Admitting: Obstetrics and Gynecology

## 2020-02-01 DIAGNOSIS — Z30016 Encounter for initial prescription of transdermal patch hormonal contraceptive device: Secondary | ICD-10-CM

## 2020-02-01 LAB — CERVICOVAGINAL ANCILLARY ONLY
Bacterial Vaginitis (gardnerella): NEGATIVE
Candida Glabrata: NEGATIVE
Candida Vaginitis: NEGATIVE
Chlamydia: NEGATIVE
Comment: NEGATIVE
Comment: NEGATIVE
Comment: NEGATIVE
Comment: NEGATIVE
Comment: NEGATIVE
Comment: NORMAL
Neisseria Gonorrhea: NEGATIVE
Trichomonas: NEGATIVE

## 2020-02-01 NOTE — Progress Notes (Signed)
Pt wants to discuss the Patch

## 2020-02-01 NOTE — Patient Instructions (Signed)
Ethinyl Estradiol; Norelgestromin skin patches What is this medicine? ETHINYL ESTRADIOL;NORELGESTROMIN (ETH in il es tra DYE ole; nor el JES troe min) skin patch is used as a contraceptive (birth control method). This medicine combines two types of female hormones, an estrogen and a progestin. This patch is used to prevent ovulation and pregnancy. This medicine may be used for other purposes; ask your health care provider or pharmacist if you have questions. COMMON BRAND NAME(S): Ortho Evra, Xulane What should I tell my health care provider before I take this medicine? They need to know if you have or ever had any of these conditions:  abnormal vaginal bleeding  blood vessel disease or blood clots  breast, cervical, endometrial, ovarian, liver, or uterine cancer  diabetes  gallbladder disease  having surgery  heart disease or recent heart attack  high blood pressure  high cholesterol or triglycerides  history of irregular heartbeat or heart valve problems  kidney disease  liver disease  migraine headaches  protein C deficiency  protein S deficiency  recently had a baby, miscarriage, or abortion  stroke  systemic lupus erythematosus (SLE)  tobacco smoker  an unusual or allergic reaction to estrogens, progestins, other medicines, foods, dyes, or preservatives  pregnant or trying to get pregnant  breast-feeding How should I use this medicine? This patch is applied to the skin. Follow the directions on the prescription label. Apply to clean, dry, healthy skin on the buttock, abdomen, upper outer arm or upper torso, in a place where it will not be rubbed by tight clothing. Do not use lotions or other cosmetics on the site where the patch will go. Press the patch firmly in place for 10 seconds to ensure good contact with the skin. Change the patch every 7 days on the same day of the week for 3 weeks. You will then have a break from the patch for 1 week, after which you  will apply a new patch. Do not use your medicine more often than directed. Contact your pediatrician regarding the use of this medicine in children. Special care may be needed. This medicine has been used in female children who have started having menstrual periods. A patient package insert for the product will be given with each prescription and refill. Read this sheet carefully each time. The sheet may change frequently. Overdosage: If you think you have taken too much of this medicine contact a poison control center or emergency room at once. NOTE: This medicine is only for you. Do not share this medicine with others. What if I miss a dose? You will need to replace your patch once a week as directed. If your patch is lost or falls off, contact your health care professional for advice. You may need to use another form of birth control if your patch has been off for more than 1 day. What may interact with this medicine? Do not take this medicine with the following medications:  dasabuvir; ombitasvir; paritaprevir; ritonavir  ombitasvir; paritaprevir; ritonavir This medicine may also interact with the following medications:  acetaminophen  antibiotics or medicines for infections, especially rifampin, rifabutin, rifapentine, and possibly penicillins or tetracyclines  aprepitant or fosaprepitant  armodafinil  ascorbic acid (vitamin C)  barbiturate medicines, such as phenobarbital or primidone  bosentan  certain antiviral medicines for hepatitis, HIV or AIDS  certain medicines for cancer treatment  certain medicines for seizures like carbamazepine, clobazam, felbamate, lamotrigine, oxcarbazepine, phenytoin, rufinamide, topiramate  certain medicines for treating high cholesterol  cyclosporine    dantrolene  elagolix  flibanserin  grapefruit juice  lesinurad  medicines for diabetes  medicines to treat fungal infections, such as griseofulvin, miconazole, fluconazole,  ketoconazole, itraconazole, posaconazole or voriconazole  mifepristone  mitotane  modafinil  morphine  mycophenolate  St. John's wort  tamoxifen  temazepam  theophylline or aminophylline  thyroid hormones  tizanidine  tranexamic acid  ulipristal  warfarin This list may not describe all possible interactions. Give your health care provider a list of all the medicines, herbs, non-prescription drugs, or dietary supplements you use. Also tell them if you smoke, drink alcohol, or use illegal drugs. Some items may interact with your medicine. What should I watch for while using this medicine? Visit your doctor or health care professional for regular checks on your progress. You will need a regular breast and pelvic exam and Pap smear while on this medicine. Use an additional method of contraception during the first cycle that you use this patch. If you have any reason to think you are pregnant, stop using this medicine right away and contact your doctor or health care professional. If you are using this medicine for hormone related problems, it may take several cycles of use to see improvement in your condition. Smoking increases the risk of getting a blood clot or having a stroke while you are using hormonal birth control, especially if you are more than 24 years old. You are strongly advised not to smoke. This medicine can make your body retain fluid, making your fingers, hands, or ankles swell. Your blood pressure can go up. Contact your doctor or health care professional if you feel you are retaining fluid. This medicine can make you more sensitive to the sun. Keep out of the sun. If you cannot avoid being in the sun, wear protective clothing and use sunscreen. Do not use sun lamps or tanning beds/booths. If you wear contact lenses and notice visual changes, or if the lenses begin to feel uncomfortable, consult your eye care specialist. In some women, tenderness, swelling, or  minor bleeding of the gums may occur. Notify your dentist if this happens. Brushing and flossing your teeth regularly may help limit this. See your dentist regularly and inform your dentist of the medicines you are taking. If you are going to have elective surgery or a MRI, you may need to stop using this medicine before the surgery or MRI. Consult your health care professional for advice. This medicine does not protect you against HIV infection (AIDS) or any other sexually transmitted diseases. What side effects may I notice from receiving this medicine? Side effects that you should report to your doctor or health care professional as soon as possible:  allergic reactions such as skin rash or itching, hives, swelling of the lips, mouth, tongue, or throat  breast tissue changes or discharge  dark patches of skin on your forehead, cheeks, upper lip, and chin  depression  high blood pressure  migraines or severe, sudden headaches  missed menstrual periods  signs and symptoms of a blood clot such as breathing problems; changes in vision; chest pain; severe, sudden headache; pain, swelling, warmth in the leg; trouble speaking; sudden numbness or weakness of the face, arm or leg  skin reactions at the patch site such as blistering, bleeding, itching, rash, or swelling  stomach pain  yellowing of the eyes or skin Side effects that usually do not require medical attention (report these to your doctor or health care professional if they continue or are bothersome):    breast tenderness  irregular vaginal bleeding or spotting, particularly during the first 3 months of use  headache  nausea  painful menstrual periods  skin redness or mild irritation at site where applied  weight gain (slight) This list may not describe all possible side effects. Call your doctor for medical advice about side effects. You may report side effects to FDA at 1-800-FDA-1088. Where should I keep my  medicine? Keep out of the reach of children. Store at room temperature between 15 and 30 degrees C (59 and 86 degrees F). Keep the patch in its pouch until time of use. Throw away any unused medicine after the expiration date. Dispose of used patches properly. Since a used patch may still contain active hormones, fold the patch in half so that it sticks to itself prior to disposal. Throw away in a place where children or pets cannot reach. NOTE: This sheet is a summary. It may not cover all possible information. If you have questions about this medicine, talk to your doctor, pharmacist, or health care provider.  2020 Elsevier/Gold Standard (2018-04-28 11:56:29)  

## 2020-02-01 NOTE — Progress Notes (Addendum)
   TELEHEALTH GYNECOLOGY VISIT ENCOUNTER NOTE  I connected with Pamela Mitchell on 02/01/20 at  1:35 PM EST by telephone at home and verified that I am speaking with the correct person using two identifiers. Chartered loss adjuster was located at [facility] for duration of virtual encounter.   I discussed the limitations, risks, security and privacy concerns of performing an evaluation and management service by telephone and the availability of in person appointments. I also discussed with the patient that there may be a patient responsible charge related to this service. The patient expressed understanding and agreed to proceed.  History:  24 y.o. G1P1001 presents for questions regarding birth control. She wants to start birth control but does not want birth control pills given "difficulty with her schedule". Nexplanon previously gave negative effect of irregular bleeding and spotting. Most interested in the hormonal patch but also interested in non-hormonal options.  No tobacco use. No known personal or family history of blood clots. No issues with high blood pressure. Periods are currently regular, monthly with normal flow. No plans for pregnancies in the future. Last sexual activity 1.5 weeks ago.   She denies any abnormal vaginal discharge, bleeding, pelvic pain or other concerns.   Past Medical History:  Diagnosis Date  . Medical history non-contributory     Past Surgical History:  Procedure Laterality Date  . NO PAST SURGERIES       Current Outpatient Medications:  .  acetaminophen (TYLENOL) 500 MG tablet, Take 1,000 mg by mouth every 6 (six) hours as needed for mild pain or fever. (Patient not taking: Reported on 01/31/2020), Disp: , Rfl:  .  levonorgestrel-ethinyl estradiol (NORDETTE) 0.15-30 MG-MCG tablet, Take 1 tablet by mouth daily. (Patient not taking: Reported on 01/31/2020), Disp: 28 tablet, Rfl: 11  The following portions of the patient's history were reviewed and updated as  appropriate: allergies, current medications, past family history, past medical history, past social history, past surgical history and problem list.   Health Maintenance:  Last pap: LSIL on 08/10/19 >need for repeat pap in 1 year  Review of Systems:  Pertinent items noted in HPI and remainder of comprehensive ROS otherwise negative.   Objective:  Physical Exam There were no vitals taken for this visit.  Gen: well appearing, no acute distress HEENT: atraumatic, normocephalic Resp: normal WOB Neuro: no gross neurologic deficits Psych: appropriate affect and behavior  Labs and Imaging No results found.  Assessment & Plan:  24 y.o. G1P1001 presents for questions regarding birth control.   1. Contraception: S/p discussion of birth control options and shared decision making, pt elects for patch at this time. No tobacco use or h/o blood clots. Declined LARC options. - pt to provide urine sample for pregnancy test tomorrow (12/29) - s/p negative pregnancy test result will send prescription for patch - counseled on need for back-up method x1 week s/p starting patch  Routine preventative health maintenance measures emphasized. Please refer to After Visit Summary for other counseling recommendations.   I provided 20 minutes of non-face-to-face time during this encounter.  Return in about 1 day (around 02/02/2020) for lab only visit (future order placed for urine pregnancy test).  No future appointments.  Sheila Oats, MD OB Fellow, [facility] 02/01/2020 7:06 PM

## 2020-05-05 ENCOUNTER — Encounter: Payer: Self-pay | Admitting: Emergency Medicine

## 2020-05-05 ENCOUNTER — Telehealth: Payer: Medicaid Other | Admitting: Emergency Medicine

## 2020-05-05 DIAGNOSIS — H00014 Hordeolum externum left upper eyelid: Secondary | ICD-10-CM | POA: Diagnosis not present

## 2020-05-05 MED ORDER — ERYTHROMYCIN 5 MG/GM OP OINT: 1.0000 "application " | TOPICAL_OINTMENT | Freq: Four times a day (QID) | OPHTHALMIC | 0 refills | Status: AC

## 2020-05-05 NOTE — Progress Notes (Signed)
[name], spragg are scheduled for a virtual visit with your provider today.    Just as we do with appointments in the office, we must obtain your consent to participate.  Your consent will be active for this visit and any virtual visit you may have with one of our providers in the next 365 days.    If you have a MyChart account, I can also send a copy of this consent to you electronically.  All virtual visits are billed to your insurance company just like a traditional visit in the office.  As this is a virtual visit, video technology does not allow for your provider to perform a traditional examination.  This may limit your provider's ability to fully assess your condition.  If your provider identifies any concerns that need to be evaluated in person or the need to arrange testing such as labs, EKG, etc, we will make arrangements to do so.    Although advances in technology are sophisticated, we cannot ensure that it will always work on either your end or our end.  If the connection with a video visit is poor, we may have to switch to a telephone visit.  With either a video or telephone visit, we are not always able to ensure that we have a secure connection.   I need to obtain your verbal consent now.   Are you willing to proceed with your visit today?   Pamela Mitchell has provided verbal consent on 05/05/2020 for a virtual visit (video or telephone).   Pamela Shadow, PA-C 05/05/2020  7:56 PM   Date:  05/05/2020   ID:  Pamela Mitchell, DOB September 01, 1995, MRN [id number]  Patient Location: Home Provider Location: Home Office   Participants: Patient and Provider for Visit and Wrap up  Method of visit: Video  Location of Patient: Home Location of Provider: Home Office Consent was obtain for visit over the video. Services rendered by provider: Visit was performed via video  A video enabled telemedicine application was used and I verified that I am speaking with the correct person using two  identifiers.  PCP:  Patient, No Pcp Per (Inactive)   Chief Complaint:  Left upper eyelid pain and redness, recurrent stye  History of Present Illness:    Pamela Mitchell is a 25 y.o. female with history as stated below. Presents video telehealth for an acute care visit  Onset of symptoms was 2 days ago and symptoms have been persistent and include: recurrent redness, swelling and pain of left upper eyelid, she believes is a stye. Pain is 6/10 at worst when the area becomes more inflamed and is touched.  This is the 4 stye in the same location in the last 2-3 months. Denies wearing new makeup. She works in a [employer] and wears disposable face mask for COVID precautions.  She does not wear glasses or contacts. No change in vision. No discharge. No pain around, redness or swelling beyond the upper eyelid.   She has not been evaluated or treated for this issue by a medical provider yet.   Denies having fevers, chills, shortness of breath, cough, chest pain, ear pain, sore throat or exposure to covid or other sick contacts.   Modifying factors include: OTC eye ointment and warm compresses with temporary relief.   No other aggravating or relieving factors.  No other c/o.   The patient does not have symptoms concerning for COVID-19 infection (fever, chills, cough, or new shortness of breath).  Patient has  not been tested for COVID during this illness.  Past Medical, Surgical, Social History, Allergies, and Medications have been Reviewed.  Patient Active Problem List   Diagnosis Date Noted  . Low grade squamous intraepith lesion on cytologic smear cervix (lgsil) 08/17/2019  . Contraception management 08/10/2019  . History of marijuana use 10/14/2016    Social History   Tobacco Use  . Smoking status: Former Smoker    Quit date: 02/16/2016    Years since quitting: 4.2  . Smokeless tobacco: Never Used  Substance Use Topics  . Alcohol use: No     Current Outpatient Medications:  .   erythromycin ophthalmic ointment, Place 1 application into the left eye 4 (four) times daily for 5 days., Disp: 20 g, Rfl: 0 .  acetaminophen (TYLENOL) 500 MG tablet, Take 1,000 mg by mouth every 6 (six) hours as needed for mild pain or fever. (Patient not taking: Reported on 01/31/2020), Disp: , Rfl:  .  levonorgestrel-ethinyl estradiol (NORDETTE) 0.15-30 MG-MCG tablet, Take 1 tablet by mouth daily. (Patient not taking: Reported on 01/31/2020), Disp: 28 tablet, Rfl: 11   No Known Allergies   Review of Systems  Constitutional: Negative for chills and fever.  Eyes: Negative for blurred vision, photophobia and discharge.  Neurological: Negative for dizziness and headaches.   See HPI for history of present illness.  Physical Exam Constitutional:      General: She is not in acute distress.    Appearance: Normal appearance. She is not ill-appearing, toxic-appearing or diaphoretic.  HENT:     Head: Normocephalic.     Nose: Nose normal.  Eyes:     General:        Left eye: Hordeolum present.No discharge.     Extraocular Movements: Extraocular movements intact.     Conjunctiva/sclera: Conjunctivae normal.      Comments: No erythema or edema beyond lesion noted on Left upper eyelid  Neurological:     Mental Status: She is alert.               A&P  1. Hordeolum externum of left upper eyelid  -Erythromycin ophthalmic ointment 4 times daily in Left eye for 5 days  -continue warm compresses  -discussed signs/symptoms that warrant further evaluation in person including worsening pain, swelling, redness despite tx.    Patient voiced understanding and agreement to plan.   Time:   Today, I have spent 15 minutes with the patient with telehealth technology discussing the above problems, reviewing the chart, previous notes, medications and orders.    Tests Ordered: No orders of the defined types were placed in this encounter.   Medication Changes: Meds ordered this encounter   Medications  . erythromycin ophthalmic ointment    Sig: Place 1 application into the left eye 4 (four) times daily for 5 days.    Dispense:  20 g    Refill:  0     Disposition:  Follow up with eye specialist if stye keeps recurring.  Advised to go to urgent care if redness, swelling, pain move beyond current location.   Cherly Hensen, PA-C  05/05/2020 7:56 PM

## 2020-05-11 ENCOUNTER — Telehealth: Payer: Medicaid Other | Admitting: Physician Assistant

## 2020-05-11 ENCOUNTER — Encounter: Payer: Self-pay | Admitting: Physician Assistant

## 2020-05-11 DIAGNOSIS — H00014 Hordeolum externum left upper eyelid: Secondary | ICD-10-CM | POA: Diagnosis not present

## 2020-05-11 NOTE — Patient Instructions (Signed)

## 2020-05-11 NOTE — Progress Notes (Signed)
[name] are scheduled for a virtual visit with your provider today.    Just as we do with appointments in the office, we must obtain your consent to participate.  Your consent will be active for this visit and any virtual visit you may have with one of our providers in the next 365 days.      All virtual visits are billed to your insurance company just like a traditional visit in the office.  As this is a virtual visit, video technology does not allow for your provider to perform a traditional examination.  This may limit your provider's ability to fully assess your condition.  If your provider identifies any concerns that need to be evaluated in person or the need to arrange testing such as labs, EKG, etc, we will make arrangements to do so.    Although advances in technology are sophisticated, we cannot ensure that it will always work on either your end or our end.  If the connection with a video visit is poor, we may have to switch to a telephone visit.  With either a video or telephone visit, we are not always able to ensure that we have a secure connection.   I need to obtain your verbal consent now.   Are you willing to proceed with your visit today?   Pamela Mitchell has provided verbal consent on 05/11/2020 for a virtual visit (video or telephone).   [name], PA-C 05/11/2020  9:25 AM   Pamela Mitchell,you are scheduled for a virtual visit with your provider today.    Just as we do with appointments in the office, we must obtain your consent to participate.  Your consent will be active for this visit and any virtual visit you may have with one of our providers in the next 365 days.    If you have a MyChart account, I can also send a copy of this consent to you electronically.  All virtual visits are billed to your insurance company just like a traditional visit in the office.  As this is a virtual visit, video technology does not allow for your provider to perform a traditional  examination.  This may limit your provider's ability to fully assess your condition.  If your provider identifies any concerns that need to be evaluated in person or the need to arrange testing such as labs, EKG, etc, we will make arrangements to do so.    Although advances in technology are sophisticated, we cannot ensure that it will always work on either your end or our end.  If the connection with a video visit is poor, we may have to switch to a telephone visit.  With either a video or telephone visit, we are not always able to ensure that we have a secure connection.   I need to obtain your verbal consent now.   Are you willing to proceed with your visit today?   Pamela Mitchell has provided verbal consent on 05/11/2020 for a virtual visit (video or telephone).   [name], PA-C 05/11/2020  9:25 AM   Date:  05/11/2020   ID:  Pamela Mitchell, DOB 17-Jun-1995, MRN [id number]  Patient Location: Home Provider Location: Home Office   Participants: Patient and Provider for Visit and Wrap up  Method of visit: Video  Location of Patient: Home Location of Provider: Home Office Consent was obtain for visit over the video. Services rendered by provider: Visit was performed via video  A video enabled  telemedicine application was used and I verified that I am speaking with the correct person using two identifiers.  PCP:  Patient, No Pcp Per (Inactive)   Chief Complaint:  stye  History of Present Illness:    Pamela Mitchell is a 25 y.o. female with history as stated below. Presents video telehealth for an acute care visit  Pt states she has had a bump to the left upper eyelid intermittently for several months. She was seen for a video visit 5 days ago and was started on erythromycin ointment. She states she has been using the ointment and warm compresses as directed but her symptoms have not completely resolved. She does report that the area was initially painful and now it is no longer  painful. The bump has also gotten smaller in size. Denies any significant redness/swelling surrounding the eye. Denies fevers, vision changes, pain with ROM of the eye. Does not use contacts or glasses.  Denies having fevers, chills, shortness of breath, cough, chest pain, ear pain, sore throat or exposure to covid or other sick contacts.  No other aggravating or relieving factors.  No other c/o.  The patient does not have symptoms concerning for COVID-19 infection (fever, chills, cough, or new shortness of breath).   Past Medical, Surgical, Social History, Allergies, and Medications have been Reviewed.  Past Medical History:  Diagnosis Date  . Medical history non-contributory     No outpatient medications have been marked as taking for the 05/11/20 encounter (Appointment) with Samson Frederic, Sherica Paternostro S, PA-C.     Allergies:   Patient has no known allergies.   ROS See HPI for history of present illness.  Physical Exam Constitutional:      General: She is not in acute distress. HENT:     Head: Normocephalic and atraumatic.     Nose: Nose normal.  Eyes:     Conjunctiva/sclera: Conjunctivae normal.     Comments: No obvious periorbital edema, erythema  Pulmonary:     Effort: Pulmonary effort is normal.  Musculoskeletal:     Cervical back: Neck supple.  Neurological:     Mental Status: She is alert.     Comments: Alert, no facial droop or ptosis               There are no diagnoses linked to this encounter.   Time:   Today, I have spent 15 minutes with the patient with telehealth technology discussing the above problems, reviewing the chart, previous notes, medications and orders.   MDM: Hordeolum  - improving on eryhtromycin ointment but not completely resolved  - would recommend continuing warm compresses and erythromycin ointment for 3-5 days  - have also recommended close pcp f/u and stated that if symptoms do not improve then she will need to be seen in person.  Tests  Ordered: No orders of the defined types were placed in this encounter.   Medication Changes: No orders of the defined types were placed in this encounter.    Disposition:  Follow up  Signed, [name], PA-C  05/11/2020 9:25 AM

## 2020-07-28 ENCOUNTER — Other Ambulatory Visit: Payer: Self-pay

## 2020-07-28 ENCOUNTER — Other Ambulatory Visit (HOSPITAL_COMMUNITY)
Admission: RE | Admit: 2020-07-28 | Discharge: 2020-07-28 | Disposition: A | Discharge status: Home / Self Care | Payer: Medicaid Other | Source: Ambulatory Visit | Attending: Family Medicine | Admitting: Family Medicine

## 2020-07-28 ENCOUNTER — Ambulatory Visit (INDEPENDENT_AMBULATORY_CARE_PROVIDER_SITE_OTHER): Payer: Medicaid Other

## 2020-07-28 VITALS — BP 115/62 | HR 78 | Ht 61.0 in | Wt 127.2 lb

## 2020-07-28 DIAGNOSIS — N898 Other specified noninflammatory disorders of vagina: Secondary | ICD-10-CM

## 2020-07-28 NOTE — Progress Notes (Signed)
Agree with A & P. 

## 2020-07-28 NOTE — Progress Notes (Signed)
Pt here today for self swab. Pt states having vaginal discharge and odor x 1 month. Pt has hx of BV in past. Pt states discharge is light yellow/mucous. Pt refused treatment until swab results. Pt also would like STD testing.   Self swab collected today. Pt advised results will take 24-48 hours and will see results in mychart and will be notified if needs further treatment. Pt verbalized understanding and agreeable to plan of care.   Judeth Cornfield, RN

## 2020-07-31 LAB — CERVICOVAGINAL ANCILLARY ONLY
Bacterial Vaginitis (gardnerella): NEGATIVE
Candida Glabrata: NEGATIVE
Candida Vaginitis: NEGATIVE
Chlamydia: NEGATIVE
Comment: NEGATIVE
Comment: NEGATIVE
Comment: NEGATIVE
Comment: NEGATIVE
Comment: NEGATIVE
Comment: NORMAL
Neisseria Gonorrhea: NEGATIVE
Trichomonas: NEGATIVE

## 2020-08-30 ENCOUNTER — Ambulatory Visit: Payer: Medicaid Other | Admitting: Obstetrics and Gynecology

## 2020-09-12 ENCOUNTER — Encounter: Payer: Self-pay | Admitting: Nurse Practitioner

## 2020-09-12 ENCOUNTER — Other Ambulatory Visit: Payer: Self-pay

## 2020-09-12 ENCOUNTER — Other Ambulatory Visit (HOSPITAL_COMMUNITY)
Admission: RE | Admit: 2020-09-12 | Discharge: 2020-09-12 | Disposition: A | Discharge status: Home / Self Care | Payer: Medicaid Other | Source: Ambulatory Visit | Attending: Obstetrics and Gynecology | Admitting: Obstetrics and Gynecology

## 2020-09-12 ENCOUNTER — Ambulatory Visit (INDEPENDENT_AMBULATORY_CARE_PROVIDER_SITE_OTHER): Payer: Medicaid Other | Admitting: Nurse Practitioner

## 2020-09-12 VITALS — BP 113/76 | HR 92 | Wt 127.1 lb

## 2020-09-12 DIAGNOSIS — R87612 Low grade squamous intraepithelial lesion on cytologic smear of cervix (LGSIL): Secondary | ICD-10-CM | POA: Diagnosis not present

## 2020-09-12 DIAGNOSIS — Z01419 Encounter for gynecological examination (general) (routine) without abnormal findings: Secondary | ICD-10-CM

## 2020-09-12 NOTE — Progress Notes (Signed)
GYNECOLOGY ANNUAL PREVENTATIVE CARE ENCOUNTER NOTE  Subjective:   Pamela Mitchell is a 25 y.o. G66P1001 female here for a routine annual gynecologic exam.  Current complaints: stopped her pills as she did not like the way they made her feel.  Does not want more children but does not desire contraception today.   Denies abnormal vaginal bleeding, discharge, pelvic pain, problems with intercourse or other gynecologic concerns.    Gynecologic History Patient's last menstrual period was 09/11/2020. Contraception: none Last Pap: 08-10-19. Results were: LSIL - repeating pap today   Obstetric History OB History  Gravida Para Term Preterm AB Living  1 1 1     1   SAB IAB Ectopic Multiple Live Births        0 1    # Outcome Date GA Lbr Len/2nd Weight Sex Delivery Anes PTL Lv  1 Term 10/14/16 [redacted]w[redacted]d 06:59 / 00:55 7 lb 6.7 oz (3.365 kg) F Vag-Spont Local  LIV    Past Medical History:  Diagnosis Date   Medical history non-contributory     Past Surgical History:  Procedure Laterality Date   NO PAST SURGERIES      No current outpatient medications on file prior to visit.   No current facility-administered medications on file prior to visit.    No Known Allergies  Social History   Socioeconomic History   Marital status: Single    Spouse name: Not on file   Number of children: Not on file   Years of education: Not on file   Highest education level: Not on file  Occupational History   Not on file  Tobacco Use   Smoking status: Former    Types: Cigarettes    Quit date: 02/16/2016    Years since quitting: 4.5   Smokeless tobacco: Never  Substance and Sexual Activity   Alcohol use: No   Drug use: Yes    Types: Marijuana    Comment: 02/15/16 last time used   Sexual activity: Yes    Birth control/protection: None  Other Topics Concern   Not on file  Social History Narrative   Not on file   Social Determinants of Health   Financial Resource Strain: Not on file  Food  Insecurity: No Food Insecurity   Worried About Running Out of Food in the Last Year: Never true   Ran Out of Food in the Last Year: Never true  Transportation Needs: No Transportation Needs   Lack of Transportation (Medical): No   Lack of Transportation (Non-Medical): No  Physical Activity: Not on file  Stress: Not on file  Social Connections: Not on file  Intimate Partner Violence: Not on file    History reviewed. No pertinent family history.  The following portions of the patient's history were reviewed and updated as appropriate: allergies, current medications, past family history, past medical history, past social history, past surgical history and problem list.  Review of Systems Pertinent items noted in HPI and remainder of comprehensive ROS otherwise negative.   Objective:  BP 113/76   Pulse 92   Wt 127 lb 1.6 oz (57.7 kg)   LMP 09/11/2020   BMI 24.02 kg/m  CONSTITUTIONAL: Well-developed, well-nourished female in no acute distress.  HENT:  Normocephalic, atraumatic, External right and left ear normal.  EYES: Conjunctivae and EOM are normal. Pupils are equal, round.  No scleral icterus.  NECK: Normal range of motion, supple, no masses.  Normal thyroid.  SKIN: Skin is warm and dry. No rash  noted. Not diaphoretic. No erythema. No pallor. NEUROLOGIC: Alert and oriented to person, place, and time. Normal reflexes, muscle tone coordination. No cranial nerve deficit noted. PSYCHIATRIC: Normal mood and affect. Normal behavior. Normal judgment and thought content. CARDIOVASCULAR: Normal heart rate noted, regular rhythm RESPIRATORY: Clear to auscultation bilaterally. Effort and breath sounds normal, no problems with respiration noted. BREASTS: Symmetric in size. No masses, skin changes, nipple drainage, or lymphadenopathy. ABDOMEN: Soft, no distention noted.  No tenderness, rebound or guarding.  PELVIC: Normal appearing external genitalia; normal appearing vaginal mucosa and cervix.   No abnormal discharge noted.  Pap smear obtained.  Normal uterine size, no other palpable masses, no uterine or adnexal tenderness. MUSCULOSKELETAL: Normal range of motion. No tenderness.  No cyanosis, clubbing, or edema.    Assessment and Plan:  1. Encounter for annual routine gynecological examination Does not want to be pregnant. Stopped using pills - did not like them - does not use condoms Advised to be aware of most fertile time - discussed using an app to follow menses Is continuing to be a nonsmoker  - Cytology - PAP( [place])  2. Low grade squamous intraepith lesion on cytologic smear cervix (lgsil) Pap done today. Discussed next steps if continues to be abnormal  Will follow up results of pap smear and manage accordingly. Routine preventative health maintenance measures emphasized. Please refer to After Visit Summary for other counseling recommendations.    Nolene Bernheim, RN, MSN, NP-BC Nurse Practitioner, [facility]

## 2020-09-14 LAB — CYTOLOGY - PAP
Comment: NEGATIVE
Diagnosis: NEGATIVE
High risk HPV: NEGATIVE
